# Patient Record
Sex: Female | Born: 1955 | Race: White | Hispanic: No | Marital: Married | Smoking: Never smoker
Health system: Southern US, Community
[De-identification: ages and names within clinical notes are randomized; demographics above are authoritative.]

## PROBLEM LIST (undated history)

## (undated) DIAGNOSIS — M1990 Unspecified osteoarthritis, unspecified site: Secondary | ICD-10-CM

## (undated) DIAGNOSIS — R053 Chronic cough: Secondary | ICD-10-CM

## (undated) DIAGNOSIS — J4540 Moderate persistent asthma, uncomplicated: Secondary | ICD-10-CM

## (undated) DIAGNOSIS — M810 Age-related osteoporosis without current pathological fracture: Secondary | ICD-10-CM

## (undated) DIAGNOSIS — R112 Nausea with vomiting, unspecified: Secondary | ICD-10-CM

## (undated) DIAGNOSIS — Z87442 Personal history of urinary calculi: Secondary | ICD-10-CM

## (undated) DIAGNOSIS — E041 Nontoxic single thyroid nodule: Secondary | ICD-10-CM

## (undated) DIAGNOSIS — R002 Palpitations: Secondary | ICD-10-CM

## (undated) DIAGNOSIS — Z8639 Personal history of other endocrine, nutritional and metabolic disease: Secondary | ICD-10-CM

## (undated) DIAGNOSIS — K219 Gastro-esophageal reflux disease without esophagitis: Secondary | ICD-10-CM

## (undated) DIAGNOSIS — Z8742 Personal history of other diseases of the female genital tract: Secondary | ICD-10-CM

## (undated) DIAGNOSIS — I10 Essential (primary) hypertension: Secondary | ICD-10-CM

## (undated) DIAGNOSIS — N200 Calculus of kidney: Secondary | ICD-10-CM

## (undated) DIAGNOSIS — E785 Hyperlipidemia, unspecified: Secondary | ICD-10-CM

## (undated) DIAGNOSIS — Z973 Presence of spectacles and contact lenses: Secondary | ICD-10-CM

## (undated) DIAGNOSIS — Z9889 Other specified postprocedural states: Secondary | ICD-10-CM

## (undated) DIAGNOSIS — R0982 Postnasal drip: Secondary | ICD-10-CM

## (undated) DIAGNOSIS — Z86010 Personal history of colonic polyps: Secondary | ICD-10-CM

## (undated) DIAGNOSIS — R05 Cough: Secondary | ICD-10-CM

## (undated) DIAGNOSIS — Z860101 Personal history of adenomatous and serrated colon polyps: Secondary | ICD-10-CM

## (undated) DIAGNOSIS — R058 Other specified cough: Secondary | ICD-10-CM

## (undated) DIAGNOSIS — E119 Type 2 diabetes mellitus without complications: Secondary | ICD-10-CM

## (undated) HISTORY — DX: Age-related osteoporosis without current pathological fracture: M81.0

## (undated) HISTORY — PX: URETERAL REIMPLANTION: SHX2611

## (undated) HISTORY — PX: TUBAL LIGATION: SHX77

## (undated) HISTORY — DX: Hyperlipidemia, unspecified: E78.5

## (undated) HISTORY — DX: Essential (primary) hypertension: I10

## (undated) HISTORY — PX: EXTRACORPOREAL SHOCK WAVE LITHOTRIPSY: SHX1557

---

## 1995-03-21 HISTORY — PX: TOTAL ABDOMINAL HYSTERECTOMY W/ BILATERAL SALPINGOOPHORECTOMY: SHX83

## 1998-05-22 ENCOUNTER — Ambulatory Visit (HOSPITAL_COMMUNITY): Admission: RE | Admit: 1998-05-22 | Discharge: 1998-05-22 | Payer: Self-pay

## 1998-05-22 ENCOUNTER — Encounter: Payer: Self-pay | Admitting: *Deleted

## 1998-06-16 ENCOUNTER — Ambulatory Visit (HOSPITAL_COMMUNITY): Admission: RE | Admit: 1998-06-16 | Discharge: 1998-06-16 | Payer: Self-pay | Admitting: Urology

## 1998-06-16 ENCOUNTER — Encounter: Payer: Self-pay | Admitting: Urology

## 1998-06-24 ENCOUNTER — Ambulatory Visit (HOSPITAL_COMMUNITY): Admission: RE | Admit: 1998-06-24 | Discharge: 1998-06-24 | Payer: Self-pay | Admitting: Urology

## 1998-06-24 ENCOUNTER — Encounter: Payer: Self-pay | Admitting: Urology

## 1998-06-26 ENCOUNTER — Encounter: Payer: Self-pay | Admitting: Emergency Medicine

## 1998-06-26 ENCOUNTER — Emergency Department (HOSPITAL_COMMUNITY): Admission: EM | Admit: 1998-06-26 | Discharge: 1998-06-26 | Payer: Self-pay | Admitting: Emergency Medicine

## 1998-08-11 ENCOUNTER — Inpatient Hospital Stay (HOSPITAL_COMMUNITY): Admission: RE | Admit: 1998-08-11 | Discharge: 1998-08-14 | Payer: Self-pay | Admitting: Urology

## 1999-11-10 ENCOUNTER — Other Ambulatory Visit: Admission: RE | Admit: 1999-11-10 | Discharge: 1999-11-10 | Payer: Self-pay | Admitting: *Deleted

## 1999-12-28 ENCOUNTER — Encounter: Payer: Self-pay | Admitting: *Deleted

## 1999-12-28 ENCOUNTER — Encounter: Admission: RE | Admit: 1999-12-28 | Discharge: 1999-12-28 | Payer: Self-pay | Admitting: *Deleted

## 2000-05-22 ENCOUNTER — Other Ambulatory Visit: Admission: RE | Admit: 2000-05-22 | Discharge: 2000-05-22 | Payer: Self-pay | Admitting: *Deleted

## 2001-01-31 ENCOUNTER — Encounter: Payer: Self-pay | Admitting: Urology

## 2001-01-31 ENCOUNTER — Ambulatory Visit (HOSPITAL_COMMUNITY): Admission: RE | Admit: 2001-01-31 | Discharge: 2001-01-31 | Payer: Self-pay | Admitting: Urology

## 2001-05-23 ENCOUNTER — Other Ambulatory Visit: Admission: RE | Admit: 2001-05-23 | Discharge: 2001-05-23 | Payer: Self-pay | Admitting: *Deleted

## 2002-05-26 ENCOUNTER — Other Ambulatory Visit: Admission: RE | Admit: 2002-05-26 | Discharge: 2002-05-26 | Payer: Self-pay | Admitting: *Deleted

## 2003-07-16 ENCOUNTER — Encounter: Admission: RE | Admit: 2003-07-16 | Discharge: 2003-07-16 | Payer: Self-pay | Admitting: Family Medicine

## 2003-08-18 ENCOUNTER — Other Ambulatory Visit: Admission: RE | Admit: 2003-08-18 | Discharge: 2003-08-18 | Payer: Self-pay | Admitting: Endocrinology

## 2004-07-27 ENCOUNTER — Encounter: Admission: RE | Admit: 2004-07-27 | Discharge: 2004-07-27 | Payer: Self-pay | Admitting: Obstetrics

## 2004-08-04 ENCOUNTER — Encounter: Admission: RE | Admit: 2004-08-04 | Discharge: 2004-08-04 | Payer: Self-pay | Admitting: Obstetrics

## 2005-08-09 ENCOUNTER — Encounter: Admission: RE | Admit: 2005-08-09 | Discharge: 2005-08-09 | Payer: Self-pay | Admitting: Endocrinology

## 2007-03-27 ENCOUNTER — Encounter: Admission: RE | Admit: 2007-03-27 | Discharge: 2007-03-27 | Payer: Self-pay | Admitting: Endocrinology

## 2010-03-02 ENCOUNTER — Encounter
Admission: RE | Admit: 2010-03-02 | Discharge: 2010-03-02 | Payer: Self-pay | Source: Home / Self Care | Attending: Obstetrics & Gynecology | Admitting: Obstetrics & Gynecology

## 2010-04-10 ENCOUNTER — Encounter: Payer: Self-pay | Admitting: Obstetrics

## 2011-04-10 ENCOUNTER — Other Ambulatory Visit: Payer: Self-pay | Admitting: Obstetrics & Gynecology

## 2011-04-10 DIAGNOSIS — Z1231 Encounter for screening mammogram for malignant neoplasm of breast: Secondary | ICD-10-CM

## 2011-04-19 ENCOUNTER — Ambulatory Visit
Admission: RE | Admit: 2011-04-19 | Discharge: 2011-04-19 | Disposition: A | Discharge status: Home / Self Care | Payer: 59 | Source: Ambulatory Visit | Attending: Obstetrics & Gynecology | Admitting: Obstetrics & Gynecology

## 2011-04-19 DIAGNOSIS — Z1231 Encounter for screening mammogram for malignant neoplasm of breast: Secondary | ICD-10-CM

## 2011-06-19 ENCOUNTER — Ambulatory Visit (INDEPENDENT_AMBULATORY_CARE_PROVIDER_SITE_OTHER): Payer: 59 | Admitting: Physician Assistant

## 2011-06-19 VITALS — BP 129/73 | HR 93 | Temp 98.6°F | Resp 16 | Ht 65.58 in | Wt 182.4 lb

## 2011-06-19 DIAGNOSIS — E119 Type 2 diabetes mellitus without complications: Secondary | ICD-10-CM | POA: Insufficient documentation

## 2011-06-19 DIAGNOSIS — R350 Frequency of micturition: Secondary | ICD-10-CM

## 2011-06-19 DIAGNOSIS — IMO0001 Reserved for inherently not codable concepts without codable children: Secondary | ICD-10-CM

## 2011-06-19 DIAGNOSIS — R300 Dysuria: Secondary | ICD-10-CM

## 2011-06-19 LAB — POCT URINALYSIS DIPSTICK
Bilirubin, UA: NEGATIVE
Blood, UA: NEGATIVE
Glucose, UA: NEGATIVE
Ketones, UA: NEGATIVE
Leukocytes, UA: NEGATIVE
Nitrite, UA: NEGATIVE
Protein, UA: NEGATIVE
Spec Grav, UA: 1.02
Urobilinogen, UA: 0.2
pH, UA: 7

## 2011-06-19 LAB — POCT UA - MICROSCOPIC ONLY
Casts, Ur, LPF, POC: NEGATIVE
Crystals, Ur, HPF, POC: NEGATIVE
Mucus, UA: NEGATIVE
RBC, urine, microscopic: NEGATIVE
Yeast, UA: NEGATIVE

## 2011-06-19 MED ORDER — CIPROFLOXACIN HCL 250 MG PO TABS: 250.0000 mg | ORAL_TABLET | Freq: Two times a day (BID) | ORAL | Status: DC

## 2011-06-19 NOTE — Progress Notes (Signed)
  Subjective:    Patient ID: Kimberly Duke, female    DOB: 1955/08/13, 56 y.o.   MRN: [id number]  HPI 5-7 day h/o urine having an odor and urine being more concentrated than normal.  No dysuria and no increase in frequency.  Patient states she usu has atypical symptoms when she has a bladder infection.  No abd pain, no vaginal discharge, no pelvic pain. FBG this am 209. Last A1C ~7 and was very recent. Endocrinologist-Balen  Review of Systems  All other systems reviewed and are negative.       Objective:   Physical Exam  Nursing note and vitals reviewed. Constitutional: She is oriented to person, place, and time. She appears well-developed and well-nourished.  HENT:  Head: Normocephalic and atraumatic.  Mouth/Throat: Oropharynx is clear and moist. No oropharyngeal exudate.  Cardiovascular: Normal rate, regular rhythm and normal heart sounds.  Exam reveals no gallop and no friction rub.   No murmur heard. Pulmonary/Chest: Effort normal and breath sounds normal.  Abdominal: Soft. Bowel sounds are normal. She exhibits no distension and no mass. There is no tenderness. There is no rebound and no guarding.       No CVA tenderness.  Neurological: She is alert and oriented to person, place, and time.  Skin: Skin is warm and dry.  Psychiatric: She has a normal mood and affect. Her behavior is normal.     Results for orders placed in visit on 06/19/11  POCT UA - MICROSCOPIC ONLY      Component Value Range   WBC, Ur, HPF, POC 0-3     RBC, urine, microscopic neg     Bacteria, U Microscopic 1+     Mucus, UA neg     Epithelial cells, urine per micros 0-5     Crystals, Ur, HPF, POC neg     Casts, Ur, LPF, POC neg     Yeast, UA neg    POCT URINALYSIS DIPSTICK      Component Value Range   Color, UA yellow     Clarity, UA clear     Glucose, UA neg     Bilirubin, UA neg     Ketones, UA neg     Spec Grav, UA 1.020     Blood, UA neg     pH, UA 7.0     Protein, UA neg     Urobilinogen, UA 0.2     Nitrite, UA neg     Leukocytes, UA Negative          Assessment & Plan:  Sending urine for culture and short course of antibiotics bc her cultures have frequently been positive.  Patient sees Dr. Horald Pollen again soon in regards to her diabetes. Increase water intake.

## 2011-06-23 LAB — URINE CULTURE: Colony Count: >=100000

## 2011-06-27 MED ORDER — CIPROFLOXACIN HCL 250 MG PO TABS: 250.0000 mg | ORAL_TABLET | Freq: Two times a day (BID) | ORAL | Status: AC

## 2011-06-27 NOTE — Progress Notes (Signed)
Addended by: Anders Simmonds on: 06/27/2011 02:53 PM   Modules accepted: Orders

## 2012-03-18 ENCOUNTER — Other Ambulatory Visit: Payer: Self-pay | Admitting: Obstetrics & Gynecology

## 2012-03-18 DIAGNOSIS — Z1231 Encounter for screening mammogram for malignant neoplasm of breast: Secondary | ICD-10-CM

## 2012-05-09 ENCOUNTER — Ambulatory Visit
Admission: RE | Admit: 2012-05-09 | Discharge: 2012-05-09 | Disposition: A | Discharge status: Home / Self Care | Payer: 59 | Source: Ambulatory Visit | Attending: Obstetrics & Gynecology | Admitting: Obstetrics & Gynecology

## 2012-05-09 DIAGNOSIS — Z1231 Encounter for screening mammogram for malignant neoplasm of breast: Secondary | ICD-10-CM

## 2012-09-30 ENCOUNTER — Ambulatory Visit (INDEPENDENT_AMBULATORY_CARE_PROVIDER_SITE_OTHER): Payer: 59 | Admitting: Family Medicine

## 2012-09-30 ENCOUNTER — Ambulatory Visit: Payer: 59

## 2012-09-30 VITALS — BP 140/80 | HR 75 | Temp 98.0°F | Resp 18 | Ht 65.5 in | Wt 184.8 lb

## 2012-09-30 DIAGNOSIS — Z0000 Encounter for general adult medical examination without abnormal findings: Secondary | ICD-10-CM

## 2012-09-30 DIAGNOSIS — N952 Postmenopausal atrophic vaginitis: Secondary | ICD-10-CM

## 2012-09-30 DIAGNOSIS — Z23 Encounter for immunization: Secondary | ICD-10-CM

## 2012-09-30 MED ORDER — ESTRADIOL 10 MCG VA TABS: ORAL_TABLET | VAGINAL | Status: DC

## 2012-09-30 NOTE — Progress Notes (Signed)
[facility] [address] [place] [address] [phone]  Date:  09/30/2012   Name:  Kimberly Duke   DOB:  07-17-1955   MRN:  [id number]  PCP:  No primary provider on file.    Chief Complaint: Annual Exam   History of Present Illness:  Kimberly Duke is a 57 y.o. very pleasant female patient who presents with the following:  Last seen here in April of 2013 for a UTI.   She is here for a CPE today.  She has seen an OBG in the past (Dr. Juliene Pina) and sees Dr. Talmage Nap for her IDDM and high cholesterol.  Dr. Talmage Nap recently checked a full battery of labs except for a CBC- she has a copy of these labs with her today.    She used premarin cream or a short period in the past- just used it a couple of times.  However, she now notes that her vaginal area has become thin and will sometimes bleed after she has sex for the last year or so.  She would like to try a topical estrogen therapy again.    She is s/p total hysterectomy about 10 years ago.  Took oral HRT until she turned 50, then stopped.   No history of abnormal pap.  Hysterectomy performed due to a "benign tumor ?fibroid.  She had several miscarriages, and also has one daughter who is 27 years old.    She has also noted a cough for many years- this is chronic.  She does not smoke, but she was exposed to second hand smoke as a child.  She would like to have a CXR.    Her last mammogram was earlier this year- normal.  She has not yet had a colonoscopy.   Patient Active Problem List   Diagnosis Date Noted  . Diabetes mellitus 06/19/2011    Past Medical History  Diagnosis Date  . Anemia   . Diabetes mellitus without complication   . Hyperlipidemia     Past Surgical History  Procedure Laterality Date  . Abdominal hysterectomy    . Tubal ligation      History  Substance Use Topics  . Smoking status: Never Smoker   . Smokeless tobacco: Not on file  . Alcohol Use: Not on file    Family History  Problem  Relation Age of Onset  . Diabetes Mother   . Hyperlipidemia Mother   . COPD Father   . Diabetes Sister   . Cancer Paternal Grandmother   . Cancer Paternal Grandfather     Allergies  Allergen Reactions  . Amoxicillin Hives  . Darvon Nausea And Vomiting    Medication list has been reviewed and updated.  Current Outpatient Prescriptions on File Prior to Visit  Medication Sig Dispense Refill  . glimepiride (AMARYL) 4 MG tablet Take 4 mg by mouth 2 (two) times daily.      Marland Kitchen loratadine (CLARITIN) 10 MG tablet Take 10 mg by mouth daily.      . metFORMIN (GLUCOPHAGE) 500 MG tablet Take 500 mg by mouth 2 (two) times daily with a meal.      . Multiple Vitamins-Minerals (CENTRUM ULTRA WOMENS PO) Take 1 tablet by mouth daily.      Marland Kitchen gemfibrozil (LOPID) 600 MG tablet Take 600 mg by mouth 2 (two) times daily before a meal.       No current facility-administered medications on file prior to visit.    Review of  Systems:  As per HPI- otherwise negative.   Physical Examination: Filed Vitals:   09/30/12 1132  BP: 140/80  Pulse: 75  Temp: 98 F (36.7 C)  Resp: 18   Filed Vitals:   09/30/12 1132  Height: 5' 5.5" (1.664 m)  Weight: 184 lb 12.8 oz (83.825 kg)   Body mass index is 30.27 kg/(m^2). Ideal Body Weight: Weight in (lb) to have BMI = 25: 152.2  GEN: WDWN, NAD, Non-toxic, A & O x 3, overweight, looks well HEENT: Atraumatic, Normocephalic. Neck supple. No masses, No LAD.  Bilateral TM wnl, oropharynx normal.  PEERL,EOMI.   Ears and Nose: No external deformity. CV: RRR, No M/G/R. No JVD. No thrill. No extra heart sounds. PULM: CTA B, no wheezes, crackles, rhonchi. No retractions. No resp. distress. No accessory muscle use. ABD: S, NT, ND, +BS. No rebound. No HSM. EXTR: No c/c/e NEURO Normal gait.  PSYCH: Normally interactive. Conversant. Not depressed or anxious appearing.  Calm demeanor.  Breast: normal, no masses, dimpling or discharge.  GU: s/p total hysterectomy.   Vaginal mucosa is slightly irritated and thin/ dry.    [facility] reading (PRIMARY) by  Dr. Patsy Lager. CXR: negative   CHEST - 2 VIEW  Comparison: None.  Findings: Lung volumes are within normal limits. Cardiac size and mediastinal contours are within normal limits. Visualized tracheal air column is within normal limits. No pneumothorax, pulmonary edema, pleural effusion or confluent pulmonary opacity. Ventricular size and configuration are within normal limits.  IMPRESSION: No acute cardiopulmonary abnormality.  Clinically significant discrepancy from primary report, if provided: None  Assessment and Plan: Physical exam - Plan: DG Chest 2 View, Tdap vaccine greater than or equal to 7yo IM  Vaginal atrophy - Plan: Estradiol 10 MCG TABS vaginal tablet  CPE- CXR done per her report.  It is reassuring.  Suggested that she try a steroid nasal spray for possible allergies, or a medication for stomach acid for possible GERD- either of these could be causing her cough. However, at this time she is not bothered much by the cough and does not want to take any further meds  Continue to follow- up DM and CHL with Dr. Talmage Nap.  Offered to draw a CBC today; she prefers to have Dr. Talmage Nap include this in her next labs as she regularly has blood drawn at her office  Pt to schedule colonoscopy Tdap updated  Vaginal atrophy: discussed increased risk of stroke, heart attack, other adverse effects with HRT.  However, risk is thought to be lower with topical therapy, as absorption is lower and systemic estrogen levels have been shown to remain nearly at baseline (although not yet tested in a large study).  Suggested that we try vagifem as it is very low dose and has less systemic absorption.  She is amenable to this idea.  Discussed possible interaction between lofibra and vagifem with PharD- risk is low due to low systemic absorption of estrogen.  She will try the vagifem twice a week and let me know if not  helpful.   Signed Abbe Amsterdam, MD

## 2012-09-30 NOTE — Patient Instructions (Addendum)
Try the vagifem tablet twice a week for your vaginal symptoms.  Let me know if this does not resolve your discomfort and bleeding   It is time to schedule a colonoscopy.  Please contact the GI doctor of your choice [facility], [place] or [facility]) and ask to schedule a screening colonoscopy  At your next visit please ask Dr. Talmage Nap to order a CBC for you- if any abnormality she can certainly send the results to me.

## 2013-04-03 ENCOUNTER — Institutional Professional Consult (permissible substitution): Payer: 59 | Admitting: Internal Medicine

## 2013-04-08 ENCOUNTER — Telehealth: Payer: Self-pay

## 2013-04-08 NOTE — Telephone Encounter (Signed)
PATIENT IS REQUESTING A COPY OF HER CHEST XRAY THAT SHE HAD DONE HERE LAST YEAR TO BE PICKED UP TOMORROW IF POSSIBLE  [phone] WHEN READY

## 2013-04-09 ENCOUNTER — Encounter: Payer: Self-pay | Admitting: Internal Medicine

## 2013-04-09 NOTE — Telephone Encounter (Signed)
Xray copied and put in the p/u folder for pt. LM to advise pt.

## 2013-04-10 ENCOUNTER — Ambulatory Visit (INDEPENDENT_AMBULATORY_CARE_PROVIDER_SITE_OTHER): Payer: 59 | Admitting: Internal Medicine

## 2013-04-10 ENCOUNTER — Encounter (INDEPENDENT_AMBULATORY_CARE_PROVIDER_SITE_OTHER): Payer: Self-pay

## 2013-04-10 ENCOUNTER — Encounter: Payer: Self-pay | Admitting: Internal Medicine

## 2013-04-10 VITALS — BP 140/64 | HR 86 | Temp 98.1°F | Ht 66.5 in | Wt 183.2 lb

## 2013-04-10 DIAGNOSIS — R05 Cough: Secondary | ICD-10-CM | POA: Insufficient documentation

## 2013-04-10 DIAGNOSIS — R059 Cough, unspecified: Secondary | ICD-10-CM

## 2013-04-10 DIAGNOSIS — R058 Other specified cough: Secondary | ICD-10-CM | POA: Insufficient documentation

## 2013-04-10 MED ORDER — PANTOPRAZOLE SODIUM 40 MG PO TBEC
40.0000 mg | DELAYED_RELEASE_TABLET | Freq: Every day | ORAL | Status: DC
Start: 2013-04-10 — End: 2013-04-10

## 2013-04-10 MED ORDER — FAMOTIDINE 20 MG PO TABS: ORAL_TABLET | ORAL | Status: DC

## 2013-04-10 MED ORDER — CETIRIZINE HCL 10 MG PO TABS: 10.0000 mg | ORAL_TABLET | Freq: Every day | ORAL | Status: DC

## 2013-04-10 MED ORDER — PANTOPRAZOLE SODIUM 40 MG PO TBEC: 40.0000 mg | DELAYED_RELEASE_TABLET | Freq: Every day | ORAL | Status: DC

## 2013-04-10 NOTE — Progress Notes (Signed)
   Subjective:    Patient ID: Kimberly Duke, female    DOB: 09/23/1955   MRN: [id number]  HPI  [age] yowf never smoker with daily  cough since 1979 gradual onset w/in a few months  p her last child was born    referred 04/10/2013 by Dr Talmage NapBalan to pulmonary clinic.  04/10/2013 1st [facility] office visit/ Kimberly Duke cc cough x 30 y min productive day >> night notes 20 min after stirring assoc with mild rhinitis better transiently clariton never tried inhalers, worse with voice use and fumes/cleaning agents.  No obvious other patterns in day to day or daytime variabilty or assoc chronic cough or cp or chest tightness, subjective wheeze overt sinus or hb symptoms. No unusual exp hx or h/o childhood pna/ asthma or knowledge of premature birth.  Sleeping ok without nocturnal  or early am exacerbation  of respiratory  c/o's or need for noct saba. Also denies any obvious fluctuation of symptoms with weather or environmental changes or other aggravating or alleviating factors except as outlined above   Current Medications, Allergies, Complete Past Medical History, Past Surgical History, Family History, and Social History were reviewed in [facility] electronic medical record.          Review of Systems  Constitutional: Negative for fever, chills and unexpected weight change.  HENT: Negative for congestion, dental problem, ear pain, nosebleeds, postnasal drip, rhinorrhea, sinus pressure, sneezing, sore throat, trouble swallowing and voice change.   Eyes: Negative for visual disturbance.  Respiratory: Positive for cough. Negative for choking and shortness of breath.   Cardiovascular: Negative for chest pain and leg swelling.  Gastrointestinal: Negative for vomiting, abdominal pain and diarrhea.  Genitourinary: Negative for difficulty urinating.  Musculoskeletal: Negative for arthralgias.  Skin: Negative for rash.  Neurological: Negative for tremors, syncope and headaches.  Hematological: Does not  bruise/bleed easily.       Objective:   Physical Exam  Wt Readings from Last 3 Encounters:  04/10/13 183 lb 3.2 oz (83.099 kg)  09/30/12 184 lb 12.8 oz (83.825 kg)  06/19/11 182 lb 6.4 oz (82.736 kg)      HEENT: nl dentition, turbinates, and orophanx. Nl external ear canals without cough reflex   NECK :  without JVD/Nodes/TM/ nl carotid upstrokes bilaterally   LUNGS: no acc muscle use, clear to A and P bilaterally without cough on insp or exp maneuvers   CV:  RRR  no s3 or murmur or increase in P2, no edema   ABD:  soft and nontender with nl excursion in the supine position. No bruits or organomegaly, bowel sounds nl  MS:  warm without deformities, calf tenderness, cyanosis or clubbing  SKIN: warm and dry without lesions    NEURO:  alert, approp, no deficits    cxr 09/30/12   No acute cardiopulmonary abnormality     Assessment & Plan:

## 2013-04-10 NOTE — Patient Instructions (Addendum)
Zyrtec 10 mg at bedtime  Pantoprazole (protonix) 40 mg   Take 30-60 min before first meal of the day and Pepcid 20 mg one bedtime until return to office - this is the best way to tell whether stomach acid is contributing to your problem.    GERD (REFLUX)  is an extremely common cause of respiratory symptoms, many times with no significant heartburn at all.    It can be treated with medication, but also with lifestyle changes including avoidance of late meals, excessive alcohol, smoking cessation, and avoid fatty foods, chocolate, peppermint, colas, red wine, and acidic juices such as orange juice.  NO MINT OR MENTHOL PRODUCTS SO NO COUGH DROPS  USE SUGARLESS CANDY INSTEAD (jolley ranchers or Stover's)  NO OIL BASED VITAMINS - use powdered substitutes.   Please schedule a follow up office visit in 4 weeks, sooner if needed

## 2013-04-11 NOTE — Assessment & Plan Note (Signed)
The most common causes of chronic cough in immunocompetent adults include the following: upper airway cough syndrome (UACS), previously referred to as postnasal drip syndrome (PNDS), which is caused by variety of rhinosinus conditions; (2) asthma; (3) GERD; (4) chronic bronchitis from cigarette smoking or other inhaled environmental irritants; (5) nonasthmatic eosinophilic bronchitis; and (6) bronchiectasis.   These conditions, singly or in combination, have accounted for up to 94% of the causes of chronic cough in prospective studies.   Other conditions have constituted no >6% of the causes in prospective studies These have included bronchogenic carcinoma, chronic interstitial pneumonia, sarcoidosis, left ventricular failure, ACEI-induced cough, and aspiration from a condition associated with pharyngeal dysfunction.    Chronic cough is often simultaneously caused by more than one condition. A single cause has been found from 38 to 82% of the time, multiple causes from 18 to 62%. Multiply caused cough has been the result of three diseases up to 42% of the time.   Given the aggravation with voice use and fumes day >> night this is almost certainly  Classic Upper airway cough syndrome, so named because it's frequently impossible to sort out how much is  CR/sinusitis with freq throat clearing (which can be related to primary GERD)   vs  causing  secondary (" extra esophageal")  GERD from wide swings in gastric pressure that occur with throat clearing, often  promoting self use of mint and menthol lozenges that reduce the lower esophageal sphincter tone and exacerbate the problem further in a cyclical fashion.   These are the same pts (now being labeled as having "irritable larynx syndrome" by some cough centers) who not infrequently have a history of having failed to tolerate ace inhibitors,  dry powder inhalers or biphosphonates or report having atypical reflux symptoms that don't respond to standard doses  of PPI , and are easily confused as having aecopd or asthma flares by even experienced allergists/ pulmonologists.  Will see if she tolerated hs zyrtec (to see if it helps as she reports some response to antihistamines in the past and to see if she tolerates it and if so consider even 1st gen H1 as per guidelines) and max rx for gerd with diet/ ppi/ h2 hs   Discussed with pt The standardized cough guidelines published in Chest by [employer] in 2006 are still the best available and consist of a multiple step process (up to 12!) , not a single office visit,  and are intended  to address this problem logically,  with an alogrithm dependent on response to empiric treatment at  each progressive step  to determine a specific diagnosis with  minimal addtional testing needed. Therefore if adherence is an issue or can't be accurately verified,  it's very unlikely the standard evaluation and treatment will be successful here.    Furthermore, response to therapy (other than acute cough suppression, which should only be used short term with avoidance of narcotic containing cough syrups if possible), can be a gradual process for which the patient may perceive immediate benefit.  Unlike going to an eye doctor where the best perscription is almost always the first one and is immediately effective, this is almost never the case in the management of chronic cough syndromes. Therefore the patient needs to commit up front to consistently adhere to recommendations  for up to 6 weeks of therapy directed at the likely underlying problem(s) before the response can be reasonably evaluated.

## 2013-05-09 ENCOUNTER — Encounter: Payer: Self-pay | Admitting: *Deleted

## 2013-05-09 ENCOUNTER — Encounter: Payer: Self-pay | Admitting: Internal Medicine

## 2013-05-09 ENCOUNTER — Ambulatory Visit (INDEPENDENT_AMBULATORY_CARE_PROVIDER_SITE_OTHER): Payer: 59 | Admitting: Internal Medicine

## 2013-05-09 VITALS — BP 114/66 | HR 90 | Temp 97.8°F | Ht 66.0 in | Wt 184.4 lb

## 2013-05-09 DIAGNOSIS — R05 Cough: Secondary | ICD-10-CM

## 2013-05-09 DIAGNOSIS — R058 Other specified cough: Secondary | ICD-10-CM

## 2013-05-09 DIAGNOSIS — R059 Cough, unspecified: Secondary | ICD-10-CM

## 2013-05-09 MED ORDER — CETIRIZINE HCL 10 MG PO TABS: 10.0000 mg | ORAL_TABLET | Freq: Every day | ORAL | Status: DC

## 2013-05-09 MED ORDER — FAMOTIDINE 20 MG PO TABS
ORAL_TABLET | ORAL | Status: DC
Start: 2013-05-09 — End: 2013-11-13

## 2013-05-09 MED ORDER — PANTOPRAZOLE SODIUM 40 MG PO TBEC: 40.0000 mg | DELAYED_RELEASE_TABLET | Freq: Every day | ORAL | Status: DC

## 2013-05-09 NOTE — Progress Notes (Signed)
Subjective:    Patient ID: Kimberly Duke, female    DOB: Apr 22, 1955   MRN: [id number]   Brief patient profile:  34 yowf never smoker with daily  cough since 1979 gradual onset w/in a few months  p her last child was born  [birth year] referred 04/10/2013 by Dr Talmage Nap to pulmonary clinic for chronic cough.    History of Present Illness  04/10/2013 1st [facility] office visit/ Kimberly Duke cc cough x 30 y min productive day >> night notes 20 min after stirring assoc with mild rhinitis better transiently clariton never tried inhalers, worse with voice use and fumes/cleaning agents. rec Zyrtec 10 mg at bedtime Pantoprazole (protonix) 40 mg   Take 30-60 min before first meal of the day and Pepcid 20 mg one bedtime until return to office - this is the best way to tell whether stomach acid is contributing to your problem.   GERD diet   05/09/2013 f/u ov/Kimberly Duke re: cough x 30 y worse with voice use/ zyrtec at bedtime  Chief Complaint  Patient presents with  . Follow-up    Pt c/o occ nonprod cough, mostly in A.M.  Pt states it has improved since lv.       No obvious other patterns in day to day or daytime variabilty or assoc sob or cp or chest tightness, subjective wheeze overt sinus or hb symptoms. No unusual exp hx or h/o childhood pna/ asthma or knowledge of premature birth.  Sleeping ok without nocturnal  or early am exacerbation  of respiratory  c/o's or need for noct saba. Also denies any obvious fluctuation of symptoms with weather or environmental changes or other aggravating or alleviating factors except as outlined above   Current Medications, Allergies, Complete Past Medical History, Past Surgical History, Family History, and Social History were reviewed in [employer] [facility] record.  ROS  The following are not active complaints unless bolded sore throat, dysphagia, dental problems, itching, sneezing,  nasal congestion or excess/ purulent secretions, ear ache,   fever, chills,  sweats, unintended wt loss, pleuritic or exertional cp, hemoptysis,  orthopnea pnd or leg swelling, presyncope, palpitations, heartburn, abdominal pain, anorexia, nausea, vomiting, diarrhea  or change in bowel or urinary habits, change in stools or urine, dysuria,hematuria,  rash, arthralgias, visual complaints, headache, numbness weakness or ataxia or problems with walking or coordination,  change in mood/affect or memory.                       Objective:   Physical Exam   05/09/2013       184  Wt Readings from Last 3 Encounters:  04/10/13 183 lb 3.2 oz (83.099 kg)  09/30/12 184 lb 12.8 oz (83.825 kg)  06/19/11 182 lb 6.4 oz (82.736 kg)      HEENT: nl dentition, turbinates, and orophanx. Nl external ear canals without cough reflex   NECK :  without JVD/Nodes/TM/ nl carotid upstrokes bilaterally   LUNGS: no acc muscle use, clear to A and P bilaterally without cough on insp or exp maneuvers   CV:  RRR  no s3 or murmur or increase in P2, no edema   ABD:  soft and nontender with nl excursion in the supine position. No bruits or organomegaly, bowel sounds nl  MS:  warm without deformities, calf tenderness, cyanosis or clubbing  SKIN: warm and dry without lesions    NEURO:  alert, approp, no deficits    cxr 09/30/12   No  acute cardiopulmonary abnormality     Assessment & Plan:

## 2013-05-09 NOTE — Patient Instructions (Addendum)
Continue Pantoprazole (protonix) 40 mg   Take 30-60 min before first meal of the day and Pepcid 20 mg one bedtime until return to office - this is the best way to tell whether stomach acid is contributing to your problem.    Try taking the zyrtec 10 mg in am if doesn't make you too sleepy, otherwise just continue to take in pm

## 2013-05-10 NOTE — Assessment & Plan Note (Signed)
Improved and likely due to  Classic Upper airway cough syndrome, so named because it's frequently impossible to sort out how much is  CR/sinusitis with freq throat clearing (which can be related to primary GERD)   vs  causing  secondary (" extra esophageal")  GERD from wide swings in gastric pressure that occur with throat clearing, often  promoting self use of mint and menthol lozenges that reduce the lower esophageal sphincter tone and exacerbate the problem further in a cyclical fashion.   These are the same pts (now being labeled as having "irritable larynx syndrome" by some cough centers) who not infrequently have a history of having failed to tolerate ace inhibitors,  dry powder inhalers or biphosphonates or report having atypical reflux symptoms that don't respond to standard doses of PPI , and are easily confused as having aecopd or asthma flares by even experienced allergists/ pulmonologists.   For now just rec she try changing the zyrtec to am if can tolerate the drowsiness she may note by taking it then and continue max rx directed at gerd/ other option is to add neurontin trial.

## 2013-08-14 ENCOUNTER — Encounter (INDEPENDENT_AMBULATORY_CARE_PROVIDER_SITE_OTHER): Payer: Self-pay

## 2013-08-14 ENCOUNTER — Ambulatory Visit (INDEPENDENT_AMBULATORY_CARE_PROVIDER_SITE_OTHER): Payer: 59 | Admitting: Internal Medicine

## 2013-08-14 ENCOUNTER — Encounter: Payer: Self-pay | Admitting: Internal Medicine

## 2013-08-14 ENCOUNTER — Other Ambulatory Visit (INDEPENDENT_AMBULATORY_CARE_PROVIDER_SITE_OTHER): Payer: 59

## 2013-08-14 VITALS — BP 140/96 | HR 89 | Temp 98.7°F | Ht 66.5 in | Wt 187.0 lb

## 2013-08-14 DIAGNOSIS — R059 Cough, unspecified: Secondary | ICD-10-CM

## 2013-08-14 DIAGNOSIS — R058 Other specified cough: Secondary | ICD-10-CM

## 2013-08-14 DIAGNOSIS — R05 Cough: Secondary | ICD-10-CM

## 2013-08-14 LAB — CBC WITH DIFFERENTIAL/PLATELET
Basophils Absolute: 0 10*3/uL (ref 0.0–0.1)
Basophils Relative: 0.8 % (ref 0.0–3.0)
Eosinophils Absolute: 0.3 10*3/uL (ref 0.0–0.7)
Eosinophils Relative: 4.8 % (ref 0.0–5.0)
HCT: 36.2 % (ref 36.0–46.0)
Hemoglobin: 12 g/dL (ref 12.0–15.0)
Lymphocytes Relative: 32.8 % (ref 12.0–46.0)
Lymphs Abs: 1.9 10*3/uL (ref 0.7–4.0)
MCHC: 33.2 g/dL (ref 30.0–36.0)
MCV: 85.6 fl (ref 78.0–100.0)
Monocytes Absolute: 0.4 10*3/uL (ref 0.1–1.0)
Monocytes Relative: 7.4 % (ref 3.0–12.0)
Neutro Abs: 3.1 10*3/uL (ref 1.4–7.7)
Neutrophils Relative %: 54.2 % (ref 43.0–77.0)
Platelets: 213 10*3/uL (ref 150.0–400.0)
RBC: 4.23 Mil/uL (ref 3.87–5.11)
RDW: 12.6 % (ref 11.5–15.5)
WBC: 5.8 10*3/uL (ref 4.0–10.5)

## 2013-08-14 NOTE — Progress Notes (Signed)
Subjective:    Patient ID: Kimberly Duke, female    DOB: [birth year]   MRN: [id number]   Brief patient profile:  49  yowf never smoker with daily  cough since 1979 gradual onset w/in a few months  p her last child was born  [birth year] referred 04/10/2013 by Dr Talmage Nap to pulmonary clinic for chronic cough.    History of Present Illness  04/10/2013 1st [facility] office visit/ Sunny Gains cc cough x 30 y min productive day >> night notes 20 min after stirring assoc with mild rhinitis better transiently clariton never tried inhalers, worse with voice use and fumes/cleaning agents. rec Zyrtec 10 mg at bedtime Pantoprazole (protonix) 40 mg   Take 30-60 min before first meal of the day and Pepcid 20 mg one bedtime until return to office - this is the best way to tell whether stomach acid is contributing to your problem.   GERD diet   05/09/2013 f/u ov/Laakea Pereira re: cough x 30 y worse with voice use/ zyrtec at bedtime  Chief Complaint  Patient presents with  . Follow-up    Pt c/o occ nonprod cough, mostly in A.M.  Pt states it has improved since lv.    rec Continue Pantoprazole (protonix) 40 mg   Take 30-60 min before first meal of the day and Pepcid 20 mg one bedtime until return to office - this is the best way to tell whether stomach acid is contributing to your problem.   Try taking the zyrtec 10 mg in am if doesn't make you too sleepy, otherwise just continue to take in pm   08/14/2013 f/u ov/Mitsuo Budnick re:  Cough x 30 y on zyrtec 10 mg in am Chief Complaint  Patient presents with  . Cough    Cough is improved. Feels that her allergies are causing some mucus production in her throat at this time.  not taking ppi consistently nor pepcid and since decreased it attributes the worse cough to her allergies but note the cough has not historically correlated with seasonal changes.  Worse with perfume exp  Cough remains dry with sense of pnds but no excess mucus even in am and problem is day >>> night   No  obvious other patterns in day to day or daytime variabilty or assoc sob or cp or chest tightness, subjective wheeze overt sinus or hb symptoms. No unusual exp hx or h/o childhood pna/ asthma or knowledge of premature birth.  Sleeping ok without nocturnal  or early am exacerbation  of respiratory  c/o's or need for noct saba. Also denies any obvious fluctuation of symptoms with weather or environmental changes or other aggravating or alleviating factors except as outlined above   Current Medications, Allergies, Complete Past Medical History, Past Surgical History, Family History, and Social History were reviewed in [employer] record.  ROS  The following are not active complaints unless bolded sore throat, dysphagia, dental problems, itching, sneezing,  nasal congestion or excess/ purulent secretions, ear ache,   fever, chills, sweats, unintended wt loss, pleuritic or exertional cp, hemoptysis,  orthopnea pnd or leg swelling, presyncope, palpitations, heartburn, abdominal pain, anorexia, nausea, vomiting, diarrhea  or change in bowel or urinary habits, change in stools or urine, dysuria,hematuria,  rash, arthralgias, visual complaints, headache, numbness weakness or ataxia or problems with walking or coordination,  change in mood/affect or memory.           Objective:   Physical Exam   05/09/2013  184 > 08/14/2013  187  Wt Readings from Last 3 Encounters:  04/10/13 183 lb 3.2 oz (83.099 kg)  09/30/12 184 lb 12.8 oz (83.825 kg)  06/19/11 182 lb 6.4 oz (82.736 kg)      Classic voice fatigue amb wf   HEENT: nl dentition, turbinates, and orophanx. Nl external ear canals without cough reflex   NECK :  without JVD/Nodes/TM/ nl carotid upstrokes bilaterally   LUNGS: no acc muscle use, clear to A and P bilaterally without cough on insp or exp maneuvers   CV:  RRR  no s3 or murmur or increase in P2, no edema   ABD:  soft and nontender with nl excursion in the  supine position. No bruits or organomegaly, bowel sounds nl  MS:  warm without deformities, calf tenderness, cyanosis or clubbing       cxr 09/30/12   No acute cardiopulmonary abnormality     Assessment & Plan:

## 2013-08-14 NOTE — Patient Instructions (Addendum)
Please remember to go to the lab  department downstairs for your tests - we will call you with the results when they are available.  I strongly recommend you maintain Pantoprazole (protonix) 40 mg   Take 30-60 min before first meal of the day and Pepcid 20 mg one bedtime   GERD (REFLUX)  is an extremely common cause of respiratory symptoms, many times with no significant heartburn at all.    It can be treated with medication, but also with lifestyle changes including avoidance of late meals, excessive alcohol, smoking cessation, and avoid fatty foods, chocolate, peppermint, colas, red wine, and acidic juices such as orange juice.  NO MINT OR MENTHOL PRODUCTS SO NO COUGH DROPS  USE SUGARLESS CANDY INSTEAD (jolley ranchers or Stover's)  NO OIL BASED VITAMINS - use powdered substitutes.  It's ok to stop the zyrtec after allergy season and just take it as needed.  Follow up here will be as needed - call if you would like referral to the voice center at [facility]

## 2013-08-15 ENCOUNTER — Encounter: Payer: Self-pay | Admitting: Internal Medicine

## 2013-08-15 LAB — ALLERGY PROFILE REGION II-DC, DE, MD, ~~LOC~~, VA
Allergen, D pternoyssinus,d7: <0.1 kU/L
Alternaria Alternata: <0.1 kU/L
Aspergillus fumigatus, m3: <0.1 kU/L
Bermuda Grass: <0.1 kU/L
Box Elder IgE: <0.1 kU/L
Cat Dander: <0.1 kU/L
Cladosporium Herbarum: <0.1 kU/L
Cockroach: <0.1 kU/L
Common Ragweed: <0.1 kU/L
D. farinae: <0.1 kU/L
Dog Dander: <0.1 kU/L
Elm IgE: <0.1 kU/L
IgE (Immunoglobulin E), Serum: 27.9 IU/mL (ref 0.0–180.0)
Johnson Grass: <0.1 kU/L
Lamb's Quarters: <0.1 kU/L
Meadow Grass: <0.1 kU/L
Oak: <0.1 kU/L
Pecan/Hickory Tree IgE: <0.1 kU/L

## 2013-08-15 NOTE — Assessment & Plan Note (Signed)
-   allergy profile 08/14/2013 >  Eos neg, IgE 27.9   Still strongly favor here dx of  Classic Upper airway cough syndrome, so named because it's frequently impossible to sort out how much is  CR/sinusitis with freq throat clearing (which can be related to primary GERD)   vs  causing  secondary (" extra esophageal")  GERD from wide swings in gastric pressure that occur with throat clearing, often  promoting self use of mint and menthol lozenges that reduce the lower esophageal sphincter tone and exacerbate the problem further in a cyclical fashion.   These are the same pts (now being labeled as having "irritable larynx syndrome" by some cough centers) who not infrequently have a history of having failed to tolerate ace inhibitors,  dry powder inhalers or biphosphonates or report having atypical reflux symptoms that don't respond to standard doses of PPI , and are easily confused as having aecopd or asthma flares by even experienced allergists/ pulmonologists.   rec max gerd rx x 6 weeks minimum and then try off zyrtec, if not happy with results next step is voice center at [facility]

## 2013-08-15 NOTE — Progress Notes (Signed)
Quick Note:  LMTCB on home and mobile ______

## 2013-08-19 ENCOUNTER — Telehealth: Payer: Self-pay | Admitting: Internal Medicine

## 2013-08-19 NOTE — Telephone Encounter (Signed)
Notes Recorded by Nyoka Cowden, MD on 08/15/2013 at 11:48 AM Call patient : Studies are unremarkable, no change in recs- no resp allergens identified ---  lmomtcb x1 for pt. Is it okay to leave detailed message on VM?

## 2013-08-19 NOTE — Telephone Encounter (Signed)
Pt called again requesting her lab results be released to her mychart Informed pt of her lab results and that her results will also be released to mychart Nothing further needed; will sign off.

## 2013-08-19 NOTE — Telephone Encounter (Signed)
Ok to leave detailed msg on her phone

## 2013-10-28 ENCOUNTER — Other Ambulatory Visit: Payer: Self-pay | Admitting: Urology

## 2013-11-12 ENCOUNTER — Encounter (HOSPITAL_BASED_OUTPATIENT_CLINIC_OR_DEPARTMENT_OTHER): Payer: Self-pay | Admitting: *Deleted

## 2013-11-13 ENCOUNTER — Encounter (HOSPITAL_BASED_OUTPATIENT_CLINIC_OR_DEPARTMENT_OTHER): Payer: Self-pay | Admitting: *Deleted

## 2013-11-13 NOTE — Progress Notes (Signed)
NPO AFTER MN. ARRIVE AT 1610. NEEDS ISTAT 8 AND EKG. WILL TAKE PROTONIX AM DOS W/ SIPS OF WATER.

## 2013-11-19 ENCOUNTER — Ambulatory Visit (HOSPITAL_BASED_OUTPATIENT_CLINIC_OR_DEPARTMENT_OTHER): Payer: 59 | Admitting: Certified Registered"

## 2013-11-19 ENCOUNTER — Ambulatory Visit (HOSPITAL_BASED_OUTPATIENT_CLINIC_OR_DEPARTMENT_OTHER)
Admission: RE | Admit: 2013-11-19 | Discharge: 2013-11-19 | Disposition: A | Discharge status: Home / Self Care | Payer: 59 | Source: Ambulatory Visit | Attending: Urology | Admitting: Urology

## 2013-11-19 ENCOUNTER — Encounter (HOSPITAL_BASED_OUTPATIENT_CLINIC_OR_DEPARTMENT_OTHER): Payer: 59 | Admitting: Certified Registered"

## 2013-11-19 ENCOUNTER — Encounter (HOSPITAL_BASED_OUTPATIENT_CLINIC_OR_DEPARTMENT_OTHER): Payer: Self-pay | Admitting: Certified Registered"

## 2013-11-19 ENCOUNTER — Encounter (HOSPITAL_BASED_OUTPATIENT_CLINIC_OR_DEPARTMENT_OTHER)
Admission: RE | Disposition: A | Discharge status: Home / Self Care | Payer: Self-pay | Source: Ambulatory Visit | Attending: Urology

## 2013-11-19 DIAGNOSIS — Z90710 Acquired absence of both cervix and uterus: Secondary | ICD-10-CM | POA: Diagnosis not present

## 2013-11-19 DIAGNOSIS — N200 Calculus of kidney: Secondary | ICD-10-CM | POA: Insufficient documentation

## 2013-11-19 DIAGNOSIS — M129 Arthropathy, unspecified: Secondary | ICD-10-CM | POA: Diagnosis not present

## 2013-11-19 DIAGNOSIS — E119 Type 2 diabetes mellitus without complications: Secondary | ICD-10-CM | POA: Diagnosis not present

## 2013-11-19 HISTORY — DX: Cough: R05

## 2013-11-19 HISTORY — DX: Presence of spectacles and contact lenses: Z97.3

## 2013-11-19 HISTORY — DX: Personal history of other endocrine, nutritional and metabolic disease: Z86.39

## 2013-11-19 HISTORY — PX: HOLMIUM LASER APPLICATION: SHX5852

## 2013-11-19 HISTORY — DX: Type 2 diabetes mellitus without complications: E11.9

## 2013-11-19 HISTORY — DX: Gastro-esophageal reflux disease without esophagitis: K21.9

## 2013-11-19 HISTORY — PX: CYSTOSCOPY WITH URETEROSCOPY AND STENT PLACEMENT: SHX6377

## 2013-11-19 HISTORY — DX: Nontoxic single thyroid nodule: E04.1

## 2013-11-19 HISTORY — DX: Personal history of other diseases of the female genital tract: Z87.42

## 2013-11-19 HISTORY — DX: Unspecified osteoarthritis, unspecified site: M19.90

## 2013-11-19 HISTORY — DX: Personal history of urinary calculi: Z87.442

## 2013-11-19 HISTORY — DX: Other specified cough: R05.8

## 2013-11-19 HISTORY — DX: Calculus of kidney: N20.0

## 2013-11-19 HISTORY — DX: Other specified postprocedural states: Z98.890

## 2013-11-19 HISTORY — DX: Other specified postprocedural states: R11.2

## 2013-11-19 HISTORY — DX: Nausea with vomiting, unspecified: R11.2

## 2013-11-19 LAB — POCT I-STAT, CHEM 8
BUN: 16 mg/dL (ref 6–23)
Calcium, Ion: 1.29 mmol/L — ABNORMAL HIGH (ref 1.12–1.23)
Chloride: 109 mEq/L (ref 96–112)
Creatinine, Ser: 0.8 mg/dL (ref 0.50–1.10)
Glucose, Bld: 157 mg/dL — ABNORMAL HIGH (ref 70–99)
HCT: 37 % (ref 36.0–46.0)
Hemoglobin: 12.6 g/dL (ref 12.0–15.0)
Potassium: 4.4 mEq/L (ref 3.7–5.3)
Sodium: 142 mEq/L (ref 137–147)
TCO2: 21 mmol/L (ref 0–100)

## 2013-11-19 LAB — GLUCOSE, CAPILLARY: Glucose-Capillary: 126 mg/dL — ABNORMAL HIGH (ref 70–99)

## 2013-11-19 SURGERY — CYSTOURETEROSCOPY, WITH STENT INSERTION
Anesthesia: General | Site: Ureter | Laterality: Left

## 2013-11-19 MED ORDER — PROPOFOL 10 MG/ML IV BOLUS
INTRAVENOUS | Status: DC | PRN
  Administered 2013-11-19: 200 mg via INTRAVENOUS

## 2013-11-19 MED ORDER — SENNOSIDES-DOCUSATE SODIUM 8.6-50 MG PO TABS: 1.0000 | ORAL_TABLET | Freq: Two times a day (BID) | ORAL | Status: DC

## 2013-11-19 MED ORDER — OXYCODONE-ACETAMINOPHEN 5-325 MG PO TABS: 1.0000 | ORAL_TABLET | Freq: Four times a day (QID) | ORAL | Status: DC | PRN

## 2013-11-19 MED ORDER — KETOROLAC TROMETHAMINE 30 MG/ML IJ SOLN
INTRAMUSCULAR | Status: DC | PRN
  Administered 2013-11-19: 30 mg via INTRAVENOUS

## 2013-11-19 MED ORDER — FENTANYL CITRATE 0.05 MG/ML IJ SOLN
INTRAMUSCULAR | Status: DC | PRN
Start: 2013-11-19 — End: 2013-11-19
  Administered 2013-11-19 (×2): 25 ug via INTRAVENOUS
  Administered 2013-11-19: 50 ug via INTRAVENOUS
  Administered 2013-11-19: 25 ug via INTRAVENOUS

## 2013-11-19 MED ORDER — OXYCODONE-ACETAMINOPHEN 5-325 MG PO TABS
ORAL_TABLET | ORAL | Status: AC
  Filled 2013-11-19: qty 1

## 2013-11-19 MED ORDER — LACTATED RINGERS IV SOLN
INTRAVENOUS | Status: DC
  Administered 2013-11-19 (×3): via INTRAVENOUS
  Filled 2013-11-19: qty 1000

## 2013-11-19 MED ORDER — FENTANYL CITRATE 0.05 MG/ML IJ SOLN
INTRAMUSCULAR | Status: AC
  Filled 2013-11-19: qty 6

## 2013-11-19 MED ORDER — IOHEXOL 350 MG/ML SOLN
INTRAVENOUS | Status: DC | PRN
  Administered 2013-11-19: 20 mL

## 2013-11-19 MED ORDER — OXYCODONE-ACETAMINOPHEN 5-325 MG PO TABS
1.0000 | ORAL_TABLET | ORAL | Status: DC | PRN
  Administered 2013-11-19: 1 via ORAL
  Filled 2013-11-19: qty 1

## 2013-11-19 MED ORDER — SODIUM CHLORIDE 0.9 % IR SOLN
Status: DC | PRN
  Administered 2013-11-19: 4000 mL

## 2013-11-19 MED ORDER — MIDAZOLAM HCL 5 MG/5ML IJ SOLN
INTRAMUSCULAR | Status: DC | PRN
  Administered 2013-11-19: 2 mg via INTRAVENOUS

## 2013-11-19 MED ORDER — PROMETHAZINE HCL 25 MG/ML IJ SOLN
6.2500 mg | INTRAMUSCULAR | Status: DC | PRN
  Filled 2013-11-19: qty 1

## 2013-11-19 MED ORDER — LIDOCAINE HCL (CARDIAC) 20 MG/ML IV SOLN
INTRAVENOUS | Status: DC | PRN
  Administered 2013-11-19: 60 mg via INTRAVENOUS

## 2013-11-19 MED ORDER — GENTAMICIN IN SALINE 1.6-0.9 MG/ML-% IV SOLN
80.0000 mg | INTRAVENOUS | Status: AC
  Administered 2013-11-19: 400 mg via INTRAVENOUS
  Filled 2013-11-19: qty 50

## 2013-11-19 MED ORDER — FENTANYL CITRATE 0.05 MG/ML IJ SOLN
25.0000 ug | INTRAMUSCULAR | Status: DC | PRN
Start: 2013-11-19 — End: 2013-11-19
  Filled 2013-11-19: qty 1

## 2013-11-19 MED ORDER — MIDAZOLAM HCL 2 MG/2ML IJ SOLN
INTRAMUSCULAR | Status: AC
  Filled 2013-11-19: qty 2

## 2013-11-19 MED ORDER — DEXAMETHASONE SODIUM PHOSPHATE 4 MG/ML IJ SOLN
INTRAMUSCULAR | Status: DC | PRN
  Administered 2013-11-19: 8 mg via INTRAVENOUS

## 2013-11-19 MED ORDER — SULFAMETHOXAZOLE-TRIMETHOPRIM 400-80 MG PO TABS: 1.0000 | ORAL_TABLET | Freq: Two times a day (BID) | ORAL | Status: DC

## 2013-11-19 MED ORDER — ONDANSETRON HCL 4 MG/2ML IJ SOLN
INTRAMUSCULAR | Status: DC | PRN
  Administered 2013-11-19: 4 mg via INTRAVENOUS

## 2013-11-19 SURGICAL SUPPLY — 48 items
BAG DRAIN URO-CYSTO SKYTR STRL (DRAIN) ×2 IMPLANT
BAG DRN UROCATH (DRAIN) ×1
BAG URO CATCHER STRL LF (DRAPE) ×2 IMPLANT
BASKET LASER NITINOL 1.9FR (BASKET) ×2 IMPLANT
BASKET STNLS GEMINI 4WIRE 3FR (BASKET) IMPLANT
BASKET ZERO TIP NITINOL 2.4FR (BASKET) IMPLANT
BSKT STON RTRVL 120 1.9FR (BASKET) ×1
BSKT STON RTRVL GEM 120X11 3FR (BASKET)
BSKT STON RTRVL ZERO TP 2.4FR (BASKET)
CANISTER SUCT LVC 12 LTR MEDI- (MISCELLANEOUS) ×2 IMPLANT
CATH INTERMIT  6FR 70CM (CATHETERS) ×2 IMPLANT
CATH URET 5FR 28IN CONE TIP (BALLOONS)
CATH URET 5FR 28IN OPEN ENDED (CATHETERS) IMPLANT
CATH URET 5FR 70CM CONE TIP (BALLOONS) IMPLANT
CLOTH BEACON ORANGE TIMEOUT ST (SAFETY) ×2 IMPLANT
DRAPE CAMERA CLOSED 9X96 (DRAPES) ×2 IMPLANT
ELECT REM PT RETURN 9FT ADLT (ELECTROSURGICAL)
ELECTRODE REM PT RTRN 9FT ADLT (ELECTROSURGICAL) IMPLANT
FIBER LASER FLEXIVA 200 (UROLOGICAL SUPPLIES) ×1 IMPLANT
FIBER LASER FLEXIVA 365 (UROLOGICAL SUPPLIES) IMPLANT
FIBER LASER TRAC TIP (UROLOGICAL SUPPLIES) IMPLANT
GLOVE BIO SURGEON STRL SZ7 (GLOVE) ×2 IMPLANT
GLOVE BIO SURGEON STRL SZ7.5 (GLOVE) ×2 IMPLANT
GLOVE BIOGEL PI IND STRL 7.5 (GLOVE) IMPLANT
GLOVE BIOGEL PI INDICATOR 7.5 (GLOVE) ×2
GOWN PREVENTION PLUS LG XLONG (DISPOSABLE) ×1 IMPLANT
GOWN PREVENTION PLUS XLARGE (GOWN DISPOSABLE) ×1 IMPLANT
GOWN STRL NON-REIN LRG LVL3 (GOWN DISPOSABLE) ×2 IMPLANT
GOWN STRL REIN XL XLG (GOWN DISPOSABLE) ×1 IMPLANT
GOWN STRL REUS W/TWL XL LVL3 (GOWN DISPOSABLE) ×2 IMPLANT
GUIDEWIRE 0.038 PTFE COATED (WIRE) IMPLANT
GUIDEWIRE ANG ZIPWIRE 038X150 (WIRE) ×2 IMPLANT
GUIDEWIRE STR DUAL SENSOR (WIRE) ×2 IMPLANT
IV NS 1000ML (IV SOLUTION) ×2
IV NS 1000ML BAXH (IV SOLUTION) IMPLANT
IV NS IRRIG 3000ML ARTHROMATIC (IV SOLUTION) ×3 IMPLANT
KIT BALLIN UROMAX 15FX10 (LABEL) IMPLANT
KIT BALLN UROMAX 15FX4 (MISCELLANEOUS) IMPLANT
KIT BALLN UROMAX 26 75X4 (MISCELLANEOUS)
PACK CYSTOSCOPY (CUSTOM PROCEDURE TRAY) ×2 IMPLANT
SET HIGH PRES BAL DIL (LABEL)
SHEATH ACCESS URETERAL 24CM (SHEATH) ×1 IMPLANT
SHEATH URET ACCESS 12FR/35CM (UROLOGICAL SUPPLIES) IMPLANT
SHEATH URET ACCESS 12FR/55CM (UROLOGICAL SUPPLIES) IMPLANT
STENT POLARIS 5FRX24 (STENTS) ×1 IMPLANT
SYRINGE 10CC LL (SYRINGE) ×2 IMPLANT
SYRINGE IRR TOOMEY STRL 70CC (SYRINGE) IMPLANT
TUBE FEEDING 8FR 16IN STR KANG (MISCELLANEOUS) IMPLANT

## 2013-11-19 NOTE — Anesthesia Preprocedure Evaluation (Signed)
Anesthesia Evaluation  Patient identified by MRN, date of birth, ID band Patient awake    Reviewed: Allergy & Precautions, H&P , NPO status , Patient's Chart, lab work & pertinent test results  History of Anesthesia Complications (+) PONV and history of anesthetic complications  Airway Mallampati: II TM Distance: >3 FB Neck ROM: Full    Dental no notable dental hx.    Pulmonary neg pulmonary ROS,  breath sounds clear to auscultation  Pulmonary exam normal       Cardiovascular negative cardio ROS  Rhythm:Regular Rate:Normal     Neuro/Psych negative neurological ROS  negative psych ROS   GI/Hepatic negative GI ROS, Neg liver ROS,   Endo/Other  diabetes, Type 2, Oral Hypoglycemic Agents  Renal/GU Renal disease  negative genitourinary   Musculoskeletal  (+) Arthritis -,   Abdominal   Peds negative pediatric ROS (+)  Hematology negative hematology ROS (+)   Anesthesia Other Findings   Reproductive/Obstetrics negative OB ROS                           Anesthesia Physical Anesthesia Plan  ASA: II  Anesthesia Plan: General   Post-op Pain Management:    Induction: Intravenous  Airway Management Planned: LMA  Additional Equipment:   Intra-op Plan:   Post-operative Plan: Extubation in OR  Informed Consent: I have reviewed the patients History and Physical, chart, labs and discussed the procedure including the risks, benefits and alternatives for the proposed anesthesia with the patient or authorized representative who has indicated his/her understanding and acceptance.   Dental advisory given  Plan Discussed with: CRNA  Anesthesia Plan Comments:         Anesthesia Quick Evaluation

## 2013-11-19 NOTE — Brief Op Note (Signed)
11/19/2013  10:34 AM  PATIENT:  Kimberly Duke  58 y.o. female  PRE-OPERATIVE DIAGNOSIS:  LEFT RENAL STONES, FLANK PAIN  POST-OPERATIVE DIAGNOSIS:  LEFT RENAL STONES, FLANK PAIN  PROCEDURE:  Procedure(s): CYSTOSCOPY WITH URETEROSCOPY , RETROGRADE AND STENT PLACEMENT, STONE EXTRACTION (Left) HOLMIUM LASER APPLICATION (Left)  SURGEON:  Surgeon(s) and Role:    * Sebastian Ache, MD - Primary  PHYSICIAN ASSISTANT:   ASSISTANTS: none   ANESTHESIA:   general  EBL:     BLOOD ADMINISTERED:none  DRAINS: none   LOCAL MEDICATIONS USED:  NONE  SPECIMEN:  Source of Specimen:  Left renal stone fragments  DISPOSITION OF SPECIMEN:  [facility] for compositional analysis  COUNTS:  YES  TOURNIQUET:  * No tourniquets in log *  DICTATION: .Other Dictation: Dictation Number [id number]  PLAN OF CARE: Discharge to home after PACU  PATIENT DISPOSITION:  PACU - hemodynamically stable.   Delay start of Pharmacological VTE agent (>24hrs) due to surgical blood loss or risk of bleeding: yes

## 2013-11-19 NOTE — Anesthesia Procedure Notes (Addendum)
Procedure Name: LMA Insertion Date/Time: 11/19/2013 9:27 AM Performed by: Renella Cunas D Pre-anesthesia Checklist: Patient identified, Emergency Drugs available, Suction available and Patient being monitored Patient Re-evaluated:Patient Re-evaluated prior to inductionOxygen Delivery Method: Circle System Utilized Preoxygenation: Pre-oxygenation with 100% oxygen Intubation Type: IV induction Ventilation: Mask ventilation without difficulty LMA: LMA inserted LMA Size: 4.0 Number of attempts: 1 Airway Equipment and Method: bite block Placement Confirmation: positive ETCO2 Tube secured with: Tape Dental Injury: Teeth and Oropharynx as per pre-operative assessment     Anesthesia Regional Block:   Narrative:    Spinal   Performed by: Felipe Drone    Anesthesia Procedure Note

## 2013-11-19 NOTE — H&P (Signed)
Kimberly Duke is an 58 y.o. female.    Chief Complaint: Pre-OP Left Ureteroscopic Stone Manipulation  HPI:   1 -Recurrent Nephrolithiasis / Left sided Abdominal Pain -  Pre 2015 - SWL x 2 left sided, most recently abroung 1995 or so 10/2013 - several days of colickly left flank pain "feels like prior stones". NO fevers or nausea with emesis. CT Stone with 9mm and 6mm left renal pelvis stone with mild hydro / likely ball-valve effect. 1200HU density. SSD 8cm.   PMH sig for IDDM2, HLD, Benign Hyst.  Today [name] is seen to proceed with left ureteroscopic stone manipulation for goal of stone free. Most recent UCX negative.   Past Medical History  Diagnosis Date  . Hyperlipidemia   . Osteoporosis   . Type 2 diabetes mellitus   . Renal calculus, left   . GERD (gastroesophageal reflux disease)     without acid reflux  . History of hypothyroidism   . Thyroid nodule     monitored by dr balan-  negartive bx's  . History of kidney stones   . History of endometriosis   . Wears glasses   . Upper airway cough syndrome     pulmologist-   dr wert  . Arthritis   . PONV (postoperative nausea and vomiting)     Past Surgical History  Procedure Laterality Date  . Tubal ligation  yrs ago  . Total abdominal hysterectomy w/ bilateral salpingoophorectomy  1997  . Extracorporeal shock wave lithotripsy Left x2  last one 1990's  . Ureteral reimplantion Right 1993 (approx)    w/  endometriosis resectED from ureter    Family History  Problem Relation Age of Onset  . Diabetes Mother   . Hyperlipidemia Mother   . COPD Father     smoked  . Diabetes Sister   . Breast cancer Paternal Grandmother   . Lung cancer Paternal Grandfather     smoked   Social History:  reports that she has never smoked. She has never used smokeless tobacco. She reports that she does not drink alcohol or use illicit drugs.  Allergies:  Allergies  Allergen Reactions  . Amoxicillin Hives  . Darvon Nausea And  Vomiting    No prescriptions prior to admission    No results found for this or any previous visit (from the past 48 hour(s)). No results found.  Review of Systems  Constitutional: Negative.  Negative for fever and chills.  HENT: Negative.   Eyes: Negative.   Respiratory: Negative.   Cardiovascular: Negative.   Gastrointestinal: Negative.  Negative for nausea and vomiting.  Genitourinary: Positive for flank pain.  Musculoskeletal: Negative.   Skin: Negative.   Neurological: Negative.   Endo/Heme/Allergies: Negative.   Psychiatric/Behavioral: Negative.     Height 5' 6.5" (1.689 m), weight 79.379 kg (175 lb). Physical Exam  Constitutional: She is oriented to person, place, and time. She appears well-developed and well-nourished.  HENT:  Head: Normocephalic.  Eyes: Pupils are equal, round, and reactive to light.  Neck: Normal range of motion. Neck supple.  Cardiovascular: Normal rate.   Respiratory: Effort normal.  GI: Soft.  Genitourinary:  Very mild left CVAT  Musculoskeletal: Normal range of motion.  Neurological: She is alert and oriented to person, place, and time.  Skin: Skin is warm and dry.  Psychiatric: She has a normal mood and affect. Her behavior is normal. Judgment and thought content normal.     Assessment/Plan  1 - Nephrolithiasis / Left sided Abdominal Pain -   likely 2/2 left UPJ stone. URS first choice given size, density. Also dicussed MET and SWL.   We rediscussed ureteroscopic stone manipulation with basketing and laser-lithotripsy in detail.  We rediscussed risks including bleeding, infection, damage to kidney / ureter  bladder, rarely loss of kidney. We rediscussed anesthetic risks and rare but serious surgical complications including DVT, PE, MI, and mortality. We specifically readdressed that in 5-10% of cases a staged approach is required with stenting followed by re-attempt ureteroscopy if anatomy unfavorable. The patient voiced understanding and  wises to proceed today as planned.   Kimberly Duke, Kimberly Duke 11/19/2013, 6:04 AM    

## 2013-11-19 NOTE — Transfer of Care (Signed)
Immediate Anesthesia Transfer of Care Note  Patient: Kimberly Duke  Procedure(s) Performed: Procedure(s) (LRB): CYSTOSCOPY WITH URETEROSCOPY , RETROGRADE AND STENT PLACEMENT, STONE EXTRACTION (Left) HOLMIUM LASER APPLICATION (Left)  Patient Location: PACU  Anesthesia Type: General  Level of Consciousness: awake, oriented, sedated and patient cooperative  Airway & Oxygen Therapy: Patient Spontanous Breathing and Patient connected to face mask oxygen  Post-op Assessment: Report given to PACU RN and Post -op Vital signs reviewed and stable  Post vital signs: Reviewed and stable  Complications: No apparent anesthesia complications

## 2013-11-19 NOTE — Discharge Instructions (Signed)
1 - You may have urinary urgency (bladder spasms) and bloody urine on / off with stent in place. This is normal. ° °2 - Call MD or go to ER for fever >102, severe pain / nausea / vomiting not relieved by medications, or acute change in medical status °[facility] °[phone] °Post Ureteroscopy With or Without Stent Instructions ° °Definitions: ° °Ureter: The duct that transports urine from the kidney to the bladder. °Stent:   A plastic hollow tube that is placed into the ureter, from the kidney to the                 bladder to prevent the ureter from swelling shut. ° °GENERAL INSTRUCTIONS: ° °Despite the fact that no skin incisions were used, the area around the ureter and bladder is raw and irritated. The stent is a foreign body which will further irritate the bladder wall. This irritation is manifested by increased frequency of urination, both day and night, and by an increase in the urge to urinate. In some, the urge to urinate is present almost always. Sometimes the urge is strong enough that you may not be able to stop yourself from urinating. The only real cure is to remove the stent and then give time for the bladder wall to heal which can't be done until the danger of the ureter swelling shut has passed, which varies. ° °You may see some blood in your urine while the stent is in place and a few days afterwards. Do not be alarmed, even if the urine was clear for a while. Get off your feet and drink lots of fluids until clearing occurs. If you start to pass clots or don't improve, call us. ° °DIET: °You may return to your normal diet immediately. Because of the raw surface of your bladder, alcohol, spicy foods, acid type foods and drinks with caffeine may cause irritation or frequency and should be used in moderation. To keep your urine flowing freely and to avoid constipation, drink plenty of fluids during the day ( 8-10 glasses ). °Tip: Avoid cranberry juice because it is very  acidic. ° °ACTIVITY: °Your physical activity doesn't need to be restricted. However, if you are very active, you may see some blood in your urine. We suggest that you reduce your activity under these circumstances until the bleeding has stopped. ° °BOWELS: °It is important to keep your bowels regular during the postoperative period. Straining with bowel movements can cause bleeding. A bowel movement every other day is reasonable. Use a mild laxative if needed, such as Milk of Magnesia 2-3 tablespoons, or 2 Dulcolax tablets. Call if you continue to have problems. If you have been taking narcotics for pain, before, during or after your surgery, you may be constipated. Take a laxative if necessary. ° ° °MEDICATION: °You should resume your pre-surgery medications unless told not to. In addition you will often be given an antibiotic to prevent infection. These should be taken as prescribed until the bottles are finished unless you are having an unusual reaction to one of the drugs. ° °PROBLEMS YOU SHOULD REPORT TO US: °· Fevers over 100.5 Fahrenheit. °· Heavy bleeding, or clots ( See above notes about blood in urine ). °· Inability to urinate. °· Drug reactions ( hives, rash, nausea, vomiting, diarrhea ). °· Severe burning or pain with urination that is not improving. ° °FOLLOW-UP: °You will need a follow-up appointment to monitor your progress. Call for this appointment at the number listed above.   Usually the first appointment will be about three to fourteen days after your surgery. ° ° ° ° ° °Post Anesthesia Home Care Instructions ° °Activity: °Get plenty of rest for the remainder of the day. A responsible adult should stay with you for 24 hours following the procedure.  °For the next 24 hours, DO NOT: °-Drive a car °-Operate machinery °-Drink alcoholic beverages °-Take any medication unless instructed by your physician °-Make any legal decisions or sign important papers. ° °Meals: °Start with liquid foods such as  gelatin or soup. Progress to regular foods as tolerated. Avoid greasy, spicy, heavy foods. If nausea and/or vomiting occur, drink only clear liquids until the nausea and/or vomiting subsides. Call your physician if vomiting continues. ° °Special Instructions/Symptoms: °Your throat may feel dry or sore from the anesthesia or the breathing tube placed in your throat during surgery. If this causes discomfort, gargle with warm salt water. The discomfort should disappear within 24 hours. ° °

## 2013-11-19 NOTE — Anesthesia Postprocedure Evaluation (Signed)
  Anesthesia Post-op Note  Patient: Kimberly Duke  Procedure(s) Performed: Procedure(s) (LRB): CYSTOSCOPY WITH URETEROSCOPY , RETROGRADE AND STENT PLACEMENT, STONE EXTRACTION (Left) HOLMIUM LASER APPLICATION (Left)  Patient Location: PACU  Anesthesia Type: general  Level of Consciousness: awake and alert   Airway and Oxygen Therapy: Patient Spontanous Breathing  Post-op Pain: mild  Post-op Assessment: Post-op Vital signs reviewed, Patient's Cardiovascular Status Stable, Respiratory Function Stable, Patent Airway and No signs of Nausea or vomiting  Last Vitals:  Filed Vitals:   11/19/13 1304  BP: 141/66  Pulse: 61  Temp: 36.7 C  Resp: 16    Post-op Vital Signs: stable   Complications: No apparent anesthesia complications

## 2013-11-20 ENCOUNTER — Encounter (HOSPITAL_BASED_OUTPATIENT_CLINIC_OR_DEPARTMENT_OTHER): Payer: Self-pay | Admitting: Urology

## 2013-11-20 NOTE — Op Note (Signed)
NAMEWENDY, Kimberly Duke NO.:  [id number]  MEDICAL RECORD NO.:  [id number]  LOCATION:                                 FACILITY:  PHYSICIAN:  Sebastian Ache, MD     DATE OF BIRTH:  [birth year]  DATE OF PROCEDURE:  11/19/2013 DATE OF DISCHARGE:  11/19/2013                              OPERATIVE REPORT   DIAGNOSIS:  Multifocal large left renal stones with left flank pain.  PROCEDURE: 1. Cystoscopy with left retrograde pyelogram interpretation. 2. Left ureteroscopy with laser lithotripsy. 3. Insertion of left ureteral stent 5 x 24, no tether.  ESTIMATED BLOOD LOSS:  Nil.  COMPLICATIONS:  None.  ASSESSMENT:  Left renal stone fragments for compositional analysis.  FINDINGS: 1. Slight relative narrowing at the area of the left UPJ without frank     obstruction or stricture.  This area easily accommodated the 8-     [place] digital ureteroscope and 2 wires. 2. One large upper pole and one smaller lower pole stone, total stone     volume approximately 1.2 cm2. 3. Complete resolution of all stone fragments larger than one-third     millimeter following laser lithotripsy and stone fragmentation.  INDICATIONS:  Kimberly Duke is a very pleasant 58 year old lady who was found on workup of colicky left flank pain to have multifocal left renal stones.  These appear to be having a ball-valving effect radiographically.  Options were discussed for management including observation versus shockwave lithotripsy versus ureteroscopy, and she wished to proceed with the latter.  Informed consent was obtained and placed in medical record.  PROCEDURE IN DETAIL:  The patient being Kimberly Duke verified, procedure being left ureteroscopic stone manipulation was confirmed. Procedure was carried out.  Time-out was performed.  Intravenous antibiotics were administered.  General LMA anesthesia was introduced. Patient placed into a low lithotomy position.  Sterile field was created by  prepping and draping the patient's vagina, introitus, and proximal thighs using iodine x3.  Next, cystourethroscopy was performed using a 22-French rigid scope 12-degree offset lens.  Inspection of the urinary bladder revealed no diverticula, calcifications, or papular lesions. The left ureteral orifice was cannulated with a 6-French end-hole catheter and left retrograde pyelogram was obtained.  Left retrograde pyelogram demonstrated single left ureter, single system left kidney.  There was some relative narrowing in the area of the UPJ, however, contrast easily went back and forth.  There was no significant hydroureteronephrosis.  There were 2 calcifications overlying the shadow of left kidney, a larger upper pole stone and a smaller lower pole stone.  A 0.038 Glidewire was advanced at the level of the upper pole and set aside as a safety wire.  Next, semi-rigid ureteroscopy performed in the entire length of left ureter alongside a separate Sensor working wire.  As expected in the area of the UPJ, there was some relative narrowing; however, there was no frank stricture in this location.  The semi-rigid ureteroscope was then exchanged with the 12/14, 24 cm ureteral access sheath at the level of proximal ureter and flexible digital ureteroscopy was performed of the proximal ureter.  Systematic inspection of the left kidney using 8-French digital ureteroscope, again the  area of the UPJ easily accommodated the scope as well as 2 wires. On inspection of the kidney x2, revealed multifocal stones, a larger upper pole stone approximately 9 mm or so and lower pole stone that was smaller.  These appeared to be much too large for simple basketing.  As such, holmium laser energy was applied to both stones using a dusting technique, which completely ablated approximately 50% of the stone volume.  Next, each fragment was purposefully fractured into pieces of 3 mm or so in diameter.  These were then  each grasped with an escape type basket and brought out in their entirety, set aside for compositional analysis.  Following these maneuvers, hemostasis appeared excellent. There was no evidence of perforation.  Also fragments larger than one- third millimeter had been removed.  There was, as suspected, some stone dust given the dusting technique, however, it was felt that the individual particles were quite small and these were easily flushed with interval stenting as planned.  The ureteral access sheath was then removed under continuous ureteroscopic vision and no mucosal abnormalities were found.  Finally, a new 5 x 24 Polaris-type stent was placed using fluoroscopic guidance with the proximal curl in the renal pelvis heading into the lower pole, the distal end of the urinary bladder.  The bladder was emptied per cystoscope.  Procedure was then terminated.  The patient tolerated the procedure well.  There were no immediate periprocedural complications.  The patient was taken to postanesthesia care unit in stable condition.          ______________________________ Sebastian Ache, MD     TM/MEDQ  D:  11/19/2013  T:  11/20/2013  Job:  [id number]

## 2013-11-23 ENCOUNTER — Other Ambulatory Visit: Payer: Self-pay | Admitting: Internal Medicine

## 2014-08-08 ENCOUNTER — Ambulatory Visit (INDEPENDENT_AMBULATORY_CARE_PROVIDER_SITE_OTHER): Payer: 59 | Admitting: Physician Assistant

## 2014-08-08 VITALS — BP 132/70 | HR 89 | Temp 98.2°F | Resp 18 | Ht 65.5 in | Wt 173.0 lb

## 2014-08-08 DIAGNOSIS — R102 Pelvic and perineal pain: Secondary | ICD-10-CM | POA: Diagnosis not present

## 2014-08-08 DIAGNOSIS — J40 Bronchitis, not specified as acute or chronic: Secondary | ICD-10-CM

## 2014-08-08 DIAGNOSIS — R05 Cough: Secondary | ICD-10-CM | POA: Diagnosis not present

## 2014-08-08 DIAGNOSIS — R059 Cough, unspecified: Secondary | ICD-10-CM

## 2014-08-08 LAB — POCT UA - MICROSCOPIC ONLY
Amorphous: POSITIVE
Casts, Ur, LPF, POC: NEGATIVE
Crystals, Ur, HPF, POC: NEGATIVE
Yeast, UA: NEGATIVE

## 2014-08-08 LAB — POCT URINALYSIS DIPSTICK
Bilirubin, UA: NEGATIVE
Blood, UA: NEGATIVE
Glucose, UA: NEGATIVE
Ketones, UA: NEGATIVE
Leukocytes, UA: NEGATIVE
Nitrite, UA: NEGATIVE
Protein, UA: NEGATIVE
Spec Grav, UA: 1.015
Urobilinogen, UA: 0.2
pH, UA: 5

## 2014-08-08 MED ORDER — BENZONATATE 100 MG PO CAPS: 100.0000 mg | ORAL_CAPSULE | Freq: Three times a day (TID) | ORAL | Status: DC | PRN

## 2014-08-08 MED ORDER — IPRATROPIUM BROMIDE 0.02 % IN SOLN
0.5000 mg | Freq: Once | RESPIRATORY_TRACT | Status: AC
  Administered 2014-08-08: 0.5 mg via RESPIRATORY_TRACT

## 2014-08-08 MED ORDER — GUAIFENESIN ER 1200 MG PO TB12: 1.0000 | ORAL_TABLET | Freq: Two times a day (BID) | ORAL | Status: DC | PRN

## 2014-08-08 MED ORDER — IPRATROPIUM BROMIDE HFA 17 MCG/ACT IN AERS: 2.0000 | INHALATION_SPRAY | Freq: Four times a day (QID) | RESPIRATORY_TRACT | Status: DC | PRN

## 2014-08-08 NOTE — Patient Instructions (Signed)
Please make sure that you are drinking water (64oz, or about 4 regular sized water bottles).  Acute Bronchitis Bronchitis is inflammation of the airways that extend from the windpipe into the lungs (bronchi). The inflammation often causes mucus to develop. This leads to a cough, which is the most common symptom of bronchitis.  In acute bronchitis, the condition usually develops suddenly and goes away over time, usually in a couple weeks. Smoking, allergies, and asthma can make bronchitis worse. Repeated episodes of bronchitis may cause further lung problems.  CAUSES Acute bronchitis is most often caused by the same virus that causes a cold. The virus can spread from person to person (contagious) through coughing, sneezing, and touching contaminated objects. SIGNS AND SYMPTOMS   Cough.   Fever.   Coughing up mucus.   Body aches.   Chest congestion.   Chills.   Shortness of breath.   Sore throat.  DIAGNOSIS  Acute bronchitis is usually diagnosed through a physical exam. Your health care provider will also ask you questions about your medical history. Tests, such as chest X-rays, are sometimes done to rule out other conditions.  TREATMENT  Acute bronchitis usually goes away in a couple weeks. Oftentimes, no medical treatment is necessary. Medicines are sometimes given for relief of fever or cough. Antibiotic medicines are usually not needed but may be prescribed in certain situations. In some cases, an inhaler may be recommended to help reduce shortness of breath and control the cough. A cool mist vaporizer may also be used to help thin bronchial secretions and make it easier to clear the chest.  HOME CARE INSTRUCTIONS  Get plenty of rest.   Drink enough fluids to keep your urine clear or pale yellow (unless you have a medical condition that requires fluid restriction). Increasing fluids may help thin your respiratory secretions (sputum) and reduce chest congestion, and it will  prevent dehydration.   Take medicines only as directed by your health care provider.  If you were prescribed an antibiotic medicine, finish it all even if you start to feel better.  Avoid smoking and secondhand smoke. Exposure to cigarette smoke or irritating chemicals will make bronchitis worse. If you are a smoker, consider using nicotine gum or skin patches to help control withdrawal symptoms. Quitting smoking will help your lungs heal faster.   Reduce the chances of another bout of acute bronchitis by washing your hands frequently, avoiding people with cold symptoms, and trying not to touch your hands to your mouth, nose, or eyes.   Keep all follow-up visits as directed by your health care provider.  SEEK MEDICAL CARE IF: Your symptoms do not improve after 1 week of treatment.  SEEK IMMEDIATE MEDICAL CARE IF:  You develop an increased fever or chills.   You have chest pain.   You have severe shortness of breath.  You have bloody sputum.   You develop dehydration.  You faint or repeatedly feel like you are going to pass out.  You develop repeated vomiting.  You develop a severe headache. MAKE SURE YOU:   Understand these instructions.  Will watch your condition.  Will get help right away if you are not doing well or get worse. Document Released: 04/13/2004 Document Revised: 07/21/2013 Document Reviewed: 08/27/2012 [facility] Patient Information 2015 [employer] [place]. This information is not intended to replace advice given to you by your health care provider. Make sure you discuss any questions you have with your health care provider.

## 2014-08-08 NOTE — Progress Notes (Signed)
[facility] [address], [place] [address] [phone]  Date:  08/08/2014   Name:  Kimberly Duke   DOB:  03-10-1956   MRN:  [id number]  PCP:  Redmond Baseman, MD    Chief Complaint: Cough   History of Present Illness:  Kimberly Duke is a 59 y.o. very pleasant female patient who presents with the following:  Patient is here for a cough that has been present for 4 days.  It is non-productive, but feels like something is lodged in her throat.  She has no fever, chills, or fatigue.  She has minor nasal congestion.  Denies sob or dyspnea.  Sick contact at home with daughter with similar symptoms, however improved within days.   She also complains of abdominal pain in her lower quadrant.  She denies dysuria, hematuria, or frequency.  She has no abnormal vaginal discharge or odor.  There is no bleeding.  She has no constipation or diarrhea.  She denies back pain.  Symptoms are not similar to her hx of kidney stone pain, she states.   Patient Active Problem List   Diagnosis Date Noted  . Upper airway cough syndrome 04/10/2013  . Diabetes mellitus 06/19/2011    Past Medical History  Diagnosis Date  . Hyperlipidemia   . Osteoporosis   . Type 2 diabetes mellitus   . Renal calculus, left   . GERD (gastroesophageal reflux disease)     without acid reflux  . History of hypothyroidism   . Thyroid nodule     monitored by dr balan-  negartive bx's  . History of kidney stones   . History of endometriosis   . Wears glasses   . Upper airway cough syndrome     pulmologist-   dr wert  . Arthritis   . PONV (postoperative nausea and vomiting)     Past Surgical History  Procedure Laterality Date  . Tubal ligation  yrs ago  . Total abdominal hysterectomy w/ bilateral salpingoophorectomy  1997  . Extracorporeal shock wave lithotripsy Left x2  last one 1990's  . Ureteral reimplantion Right 1993 (approx)    w/  endometriosis resectED from ureter  . Cystoscopy  with ureteroscopy and stent placement Left 11/19/2013    Procedure: CYSTOSCOPY WITH URETEROSCOPY , RETROGRADE AND STENT PLACEMENT, STONE EXTRACTION;  Surgeon: Sebastian Ache, MD;  Location: [facility];  Service: Urology;  Laterality: Left;  . Holmium laser application Left 11/19/2013    Procedure: HOLMIUM LASER APPLICATION;  Surgeon: Sebastian Ache, MD;  Location: [facility];  Service: Urology;  Laterality: Left;    History  Substance Use Topics  . Smoking status: Never Smoker   . Smokeless tobacco: Never Used  . Alcohol Use: No    Family History  Problem Relation Age of Onset  . Diabetes Mother   . Hyperlipidemia Mother   . COPD Father     smoked  . Diabetes Sister   . Breast cancer Paternal Grandmother   . Cancer Paternal Grandmother     breast cancer (older onset)  . Lung cancer Paternal Grandfather     smoked  . Cancer Paternal Grandfather     lung (smoker)  . Diabetes Brother   . Hypertension Brother   . Cancer Maternal Grandmother 48    breast cancer  . Cancer Maternal Grandfather     lung cancer    Allergies  Allergen Reactions  . Amoxicillin Hives  . Darvon Nausea And  Vomiting    Medication list has been reviewed and updated.  Current Outpatient Prescriptions on File Prior to Visit  Medication Sig Dispense Refill  . fenofibrate 160 MG tablet Take 160 mg by mouth daily.    Marland Kitchen glimepiride (AMARYL) 4 MG tablet Take 4 mg by mouth 2 (two) times daily.    Marland Kitchen ibuprofen (ADVIL,MOTRIN) 200 MG tablet Take 200 mg by mouth every 6 (six) hours as needed.    . insulin glargine (LANTUS) 100 UNIT/ML injection Inject 30 Units into the skin at bedtime.     . metFORMIN (GLUCOPHAGE) 500 MG tablet Take 1,000 mg by mouth 2 (two) times daily.     . Multiple Vitamin (MULTIVITAMIN) tablet Take 1 tablet by mouth daily.    . psyllium (METAMUCIL) 58.6 % powder Take 1 packet by mouth daily.     No current facility-administered medications on file prior to  visit.    Review of Systems: ROS otherwise unremarkable unless listed above.  Physical Examination: Filed Vitals:   08/08/14 0822  BP: 132/70  Pulse: 89  Temp: 98.2 F (36.8 C)  Resp: 18   Filed Vitals:   08/08/14 0822  Height: 5' 5.5" (1.664 m)  Weight: 173 lb (78.472 kg)   Body mass index is 28.34 kg/(m^2). Ideal Body Weight: Weight in (lb) to have BMI = 25: 152.2  Physical Exam  Constitutional: She appears well-nourished.  HENT:  Right Ear: Tympanic membrane, external ear and ear canal normal.  Left Ear: Tympanic membrane, external ear and ear canal normal.  Nose: Rhinorrhea present. No mucosal edema. Right sinus exhibits no maxillary sinus tenderness and no frontal sinus tenderness. Left sinus exhibits no maxillary sinus tenderness and no frontal sinus tenderness.  Mouth/Throat: No uvula swelling. No oropharyngeal exudate, posterior oropharyngeal edema or posterior oropharyngeal erythema.  Cardiovascular: Normal rate and regular rhythm.  Exam reveals no gallop and no friction rub.   No murmur heard. Pulmonary/Chest: No respiratory distress. She has no decreased breath sounds. She has no wheezes. She has rhonchi (Throughout lungs).  Abdominal: Soft. Normal appearance and bowel sounds are normal. There is tenderness in the right lower quadrant, suprapubic area and left lower quadrant. There is no CVA tenderness.   Results for orders placed or performed in visit on 08/08/14  POCT UA - Microscopic Only  Result Value Ref Range   WBC, Ur, HPF, POC 0-4    RBC, urine, microscopic 0-2    Bacteria, U Microscopic trace    Mucus, UA trace    Epithelial cells, urine per micros 0-3    Crystals, Ur, HPF, POC neg    Casts, Ur, LPF, POC neg    Yeast, UA neg    Amorphous pos   POCT urinalysis dipstick  Result Value Ref Range   Color, UA yellow    Clarity, UA clear    Glucose, UA neg    Bilirubin, UA neg    Ketones, UA neg    Spec Grav, UA 1.015    Blood, UA neg    pH, UA 5.0     Protein, UA neg    Urobilinogen, UA 0.2    Nitrite, UA neg    Leukocytes, UA Negative    Peak flow with increase post nebulizer 200-->330  Assessment and Plan: 59 year old female is here today for chief complaint of cough, and pelvic pain. -Bronchitis without infection, given inhaler and supportive treatment. -Urine culture placed.  Cough - Plan: benzonatate (TESSALON) 100 MG capsule, Guaifenesin (MUCINEX MAXIMUM  STRENGTH) 1200 MG TB12, ipratropium (ATROVENT) nebulizer solution 0.5 mg  Pelvic pain in female - Plan: POCT UA - Microscopic Only, POCT urinalysis dipstick, Urine culture, ipratropium (ATROVENT) nebulizer solution 0.5 mg, Urine culture  Bronchitis - Plan: ipratropium (ATROVENT HFA) 17 MCG/ACT inhaler, ipratropium (ATROVENT) nebulizer solution 0.5 mg   Trena PlattStephanie Kenyah Luba, PA-C [facility] 5/21/201610:17 AM

## 2014-08-10 LAB — URINE CULTURE

## 2014-08-12 ENCOUNTER — Ambulatory Visit (INDEPENDENT_AMBULATORY_CARE_PROVIDER_SITE_OTHER): Payer: 59 | Admitting: Urgent Care

## 2014-08-12 VITALS — BP 142/68 | HR 100 | Temp 99.0°F | Resp 16 | Ht 65.5 in | Wt 173.0 lb

## 2014-08-12 DIAGNOSIS — R05 Cough: Secondary | ICD-10-CM | POA: Diagnosis not present

## 2014-08-12 DIAGNOSIS — R0989 Other specified symptoms and signs involving the circulatory and respiratory systems: Secondary | ICD-10-CM | POA: Diagnosis not present

## 2014-08-12 DIAGNOSIS — R030 Elevated blood-pressure reading, without diagnosis of hypertension: Secondary | ICD-10-CM | POA: Diagnosis not present

## 2014-08-12 DIAGNOSIS — J029 Acute pharyngitis, unspecified: Secondary | ICD-10-CM

## 2014-08-12 DIAGNOSIS — R0789 Other chest pain: Secondary | ICD-10-CM

## 2014-08-12 DIAGNOSIS — R059 Cough, unspecified: Secondary | ICD-10-CM

## 2014-08-12 DIAGNOSIS — J209 Acute bronchitis, unspecified: Secondary | ICD-10-CM | POA: Diagnosis not present

## 2014-08-12 MED ORDER — AZITHROMYCIN 500 MG PO TABS: 500.0000 mg | ORAL_TABLET | Freq: Every day | ORAL | Status: DC

## 2014-08-12 MED ORDER — HYDROCODONE-HOMATROPINE 5-1.5 MG/5ML PO SYRP: 5.0000 mL | ORAL_SOLUTION | Freq: Every evening | ORAL | Status: DC | PRN

## 2014-08-12 NOTE — Progress Notes (Signed)
    MRN: [id number] DOB: 03/15/1956  Subjective:   Kimberly Duke is a 59 y.o. female presenting for chief complaint of Follow-up  Reports 2 week history of cough, sore throat, chest congestion now having lymph node pain, bilateral ear pain, subjective fever. Still has chest congestion, chest tightness. Patient was [facility] at [facility] on 08/08/2014, managed conservatively. She has been taking meds as prescribed including Tessalon pearles, Mucinex, atrovent inhaler, robitussin at night but has not had any improvement/relief with this. Denies chest pain, shob, wheezing, n/v, abdominal pain, ear drainage, productive cough, hemoptysis. Denies history of asthma, takes Claritin daily for seasonal allergies. Denies smoking, alcohol use. Denies any other aggravating or relieving factors, no other questions or concerns.  [name] has a current medication list which includes the following prescription(s): benzonatate, fenofibrate, glimepiride, guaifenesin, ibuprofen, insulin glargine, ipratropium, loratadine, metformin, multivitamin, and psyllium. She is allergic to amoxicillin and darvon.  [name]  has a past medical history of Hyperlipidemia; Osteoporosis; Type 2 diabetes mellitus; Renal calculus, left; GERD (gastroesophageal reflux disease); History of hypothyroidism; Thyroid nodule; History of kidney stones; History of endometriosis; Wears glasses; Upper airway cough syndrome; Arthritis; and PONV (postoperative nausea and vomiting). Also  has past surgical history that includes Tubal ligation (yrs ago); Total abdominal hysterectomy w/ bilateral salpingoophorectomy (1997); Extracorporeal shock wave lithotripsy (Left, x2  last one 1990's); Ureteral reimplantion (Right, 1993 (approx)); Cystoscopy with ureteroscopy and stent placement (Left, 11/19/2013); and Holmium laser application (Left, 11/19/2013).  ROS As in subjective.  Objective:   Vitals: BP 142/68 mmHg  Pulse 100  Temp(Src) 99 F  (37.2 C) (Oral)  Resp 16  Ht 5' 5.5" (1.664 m)  Wt 173 lb (78.472 kg)  BMI 28.34 kg/m2  SpO2 98%  Physical Exam  Constitutional: She appears well-developed and well-nourished.  HENT:  TM's flat bilaterally, no effusions or erythema. Nasal turbinates pink and moist. Mild frontal sinus tenderness. Mild postnasal drip present, without oropharyngeal exudates, erythema or abscesses.   Eyes: Conjunctivae are normal. Right eye exhibits no discharge. Left eye exhibits no discharge. No scleral icterus.  Cardiovascular: Normal rate, regular rhythm and intact distal pulses.  Exam reveals no gallop and no friction rub.   No murmur heard. Pulmonary/Chest: No stridor. No respiratory distress. She has no wheezes. She has no rales. She exhibits no tenderness.  Coarse lung sounds at lower bases. No dullness to percussion.  Lymphadenopathy:    She has cervical adenopathy (bilateral, anterior).  Skin: Skin is warm and dry. No rash noted. No erythema. No pallor.   Assessment and Plan :   1. Acute bronchitis, unspecified organism 2. Cough 3. Chest congestion 4. Chest tightness 5. Sore throat - Will start azithromycin course, Hycodan at night, continue Tessalon, stop Mucinex and Atrovent inhaler. Return to clinic in 1 week if no improvement in symptoms.  6. Elevated blood pressure reading without diagnosis of hypertension - Continue to monitor. Patient denies history of Hypertension.  Kimberly BambergMario Camira Geidel, PA-C [facility] [phone] 08/12/2014 5:07 PM

## 2014-08-12 NOTE — Patient Instructions (Signed)

## 2014-09-14 ENCOUNTER — Other Ambulatory Visit: Payer: Self-pay

## 2014-09-25 ENCOUNTER — Ambulatory Visit (INDEPENDENT_AMBULATORY_CARE_PROVIDER_SITE_OTHER): Payer: 59 | Admitting: Urgent Care

## 2014-09-25 VITALS — BP 118/68 | HR 106 | Temp 99.2°F | Resp 20 | Ht 66.5 in | Wt 172.0 lb

## 2014-09-25 DIAGNOSIS — N3090 Cystitis, unspecified without hematuria: Secondary | ICD-10-CM | POA: Insufficient documentation

## 2014-09-25 DIAGNOSIS — R109 Unspecified abdominal pain: Secondary | ICD-10-CM | POA: Diagnosis not present

## 2014-09-25 DIAGNOSIS — Z87442 Personal history of urinary calculi: Secondary | ICD-10-CM

## 2014-09-25 DIAGNOSIS — Z87440 Personal history of urinary (tract) infections: Secondary | ICD-10-CM

## 2014-09-25 LAB — POCT UA - MICROSCOPIC ONLY
Casts, Ur, LPF, POC: NEGATIVE
Crystals, Ur, HPF, POC: NEGATIVE
Mucus, UA: POSITIVE
Yeast, UA: NEGATIVE

## 2014-09-25 LAB — POCT URINALYSIS DIPSTICK
Blood, UA: NEGATIVE
Glucose, UA: NEGATIVE
Ketones, UA: NEGATIVE
Leukocytes, UA: NEGATIVE
Nitrite, UA: NEGATIVE
Protein, UA: 30
Spec Grav, UA: 1.025
Urobilinogen, UA: 1
pH, UA: 6

## 2014-09-25 MED ORDER — NITROFURANTOIN MONOHYD MACRO 100 MG PO CAPS: 100.0000 mg | ORAL_CAPSULE | Freq: Two times a day (BID) | ORAL | Status: DC

## 2014-09-25 NOTE — Patient Instructions (Signed)
Urinary Tract Infection  Urinary tract infections (UTIs) can develop anywhere along your urinary tract. Your urinary tract is your body's drainage system for removing wastes and extra water. Your urinary tract includes two kidneys, two ureters, a bladder, and a urethra. Your kidneys are a pair of bean-shaped organs. Each kidney is about the size of your fist. They are located below your ribs, one on each side of your spine.  CAUSES  Infections are caused by microbes, which are microscopic organisms, including fungi, viruses, and bacteria. These organisms are so small that they can only be seen through a microscope. Bacteria are the microbes that most commonly cause UTIs.  SYMPTOMS   Symptoms of UTIs may vary by age and gender of the patient and by the location of the infection. Symptoms in young women typically include a frequent and intense urge to urinate and a painful, burning feeling in the bladder or urethra during urination. Older women and men are more likely to be tired, shaky, and weak and have muscle aches and abdominal pain. A fever may mean the infection is in your kidneys. Other symptoms of a kidney infection include pain in your back or sides below the ribs, nausea, and vomiting.  DIAGNOSIS  To diagnose a UTI, your caregiver will ask you about your symptoms. Your caregiver also will ask to provide a urine sample. The urine sample will be tested for bacteria and white blood cells. White blood cells are made by your body to help fight infection.  TREATMENT   Typically, UTIs can be treated with medication. Because most UTIs are caused by a bacterial infection, they usually can be treated with the use of antibiotics. The choice of antibiotic and length of treatment depend on your symptoms and the type of bacteria causing your infection.  HOME CARE INSTRUCTIONS   If you were prescribed antibiotics, take them exactly as your caregiver instructs you. Finish the medication even if you feel better after you  have only taken some of the medication.   Drink enough water and fluids to keep your urine clear or pale yellow.   Avoid caffeine, tea, and carbonated beverages. They tend to irritate your bladder.   Empty your bladder often. Avoid holding urine for long periods of time.   Empty your bladder before and after sexual intercourse.   After a bowel movement, women should cleanse from front to back. Use each tissue only once.  SEEK MEDICAL CARE IF:    You have back pain.   You develop a fever.   Your symptoms do not begin to resolve within 3 days.  SEEK IMMEDIATE MEDICAL CARE IF:    You have severe back pain or lower abdominal pain.   You develop chills.   You have nausea or vomiting.   You have continued burning or discomfort with urination.  MAKE SURE YOU:    Understand these instructions.   Will watch your condition.   Will get help right away if you are not doing well or get worse.  Document Released: 12/14/2004 Document Revised: 09/05/2011 Document Reviewed: 04/14/2011  ExitCare Patient Information 2015 [employer]. This information is not intended to replace advice given to you by your health care provider. Make sure you discuss any questions you have with your health care provider.          Kidney Stones  Kidney stones (urolithiasis) are deposits that form inside your kidneys. The intense pain is caused by the stone moving through the urinary tract. When   the stone moves, the ureter goes into spasm around the stone. The stone is usually passed in the urine.   CAUSES    A disorder that makes certain neck glands produce too much parathyroid hormone (primary hyperparathyroidism).   A buildup of uric acid crystals, similar to gout in your joints.   Narrowing (stricture) of the ureter.   A kidney obstruction present at birth (congenital obstruction).   Previous surgery on the kidney or ureters.   Numerous kidney infections.  SYMPTOMS    Feeling sick to your stomach (nauseous).   Throwing up  (vomiting).   Blood in the urine (hematuria).   Pain that usually spreads (radiates) to the groin.   Frequency or urgency of urination.  DIAGNOSIS    Taking a history and physical exam.   Blood or urine tests.   CT scan.   Occasionally, an examination of the inside of the urinary bladder (cystoscopy) is performed.  TREATMENT    Observation.   Increasing your fluid intake.   Extracorporeal shock wave lithotripsy--This is a noninvasive procedure that uses shock waves to break up kidney stones.   Surgery may be needed if you have severe pain or persistent obstruction. There are various surgical procedures. Most of the procedures are performed with the use of small instruments. Only small incisions are needed to accommodate these instruments, so recovery time is minimized.  The size, location, and chemical composition are all important variables that will determine the proper choice of action for you. Talk to your health care provider to better understand your situation so that you will minimize the risk of injury to yourself and your kidney.   HOME CARE INSTRUCTIONS    Drink enough water and fluids to keep your urine clear or pale yellow. This will help you to pass the stone or stone fragments.   Strain all urine through the provided strainer. Keep all particulate matter and stones for your health care provider to see. The stone causing the pain may be as small as a grain of salt. It is very important to use the strainer each and every time you pass your urine. The collection of your stone will allow your health care provider to analyze it and verify that a stone has actually passed. The stone analysis will often identify what you can do to reduce the incidence of recurrences.   Only take over-the-counter or prescription medicines for pain, discomfort, or fever as directed by your health care provider.   Make a follow-up appointment with your health care provider as directed.   Get follow-up X-rays if  required. The absence of pain does not always mean that the stone has passed. It may have only stopped moving. If the urine remains completely obstructed, it can cause loss of kidney function or even complete destruction of the kidney. It is your responsibility to make sure X-rays and follow-ups are completed. Ultrasounds of the kidney can show blockages and the status of the kidney. Ultrasounds are not associated with any radiation and can be performed easily in a matter of minutes.  SEEK MEDICAL CARE IF:   You experience pain that is progressive and unresponsive to any pain medicine you have been prescribed.  SEEK IMMEDIATE MEDICAL CARE IF:    Pain cannot be controlled with the prescribed medicine.   You have a fever or shaking chills.   The severity or intensity of pain increases over 18 hours and is not relieved by pain medicine.   You develop a   new onset of abdominal pain.   You feel faint or pass out.   You are unable to urinate.  MAKE SURE YOU:    Understand these instructions.   Will watch your condition.   Will get help right away if you are not doing well or get worse.  Document Released: 03/06/2005 Document Revised: 11/06/2012 Document Reviewed: 08/07/2012  [employer] Patient Information 2015 [employer]. This information is not intended to replace advice given to you by your health care provider. Make sure you discuss any questions you have with your health care provider.

## 2014-09-25 NOTE — Progress Notes (Signed)
MRN: [id number] DOB: 21-Dec-1955  Subjective:   Kimberly Duke is a 59 y.o. female presenting for chief complaint of Urinary Tract Infection; Back Pain; and Chills  Reports 3 day history of cloudy malodorous urine, low back pain, abdominal pain, started having chills last night. Has tried Tylenol with some relief. Admits history of UTIs, renal stones requiring lithotripsy. Denies fever, dysuria, hematuria, urinary frequency, urinary urgency, flank pain and pelvic pain, nausea, vomiting, diarrhea, genital rashes or genital irritation. Denies any other aggravating or relieving factors, no other questions or concerns.  Kimberly Duke has a current medication list which includes the following prescription(s): benzonatate, fenofibrate, glimepiride, hydrocodone-homatropine, ibuprofen, insulin glargine, loratadine, metformin, multivitamin, and psyllium. She is allergic to amoxicillin and darvon.  Kimberly Duke  has a past medical history of Hyperlipidemia; Osteoporosis; Type 2 diabetes mellitus; Renal calculus, left; GERD (gastroesophageal reflux disease); History of hypothyroidism; Thyroid nodule; History of kidney stones; History of endometriosis; Wears glasses; Upper airway cough syndrome; Arthritis; and PONV (postoperative nausea and vomiting). Also  has past surgical history that includes Tubal ligation (yrs ago); Total abdominal hysterectomy w/ bilateral salpingoophorectomy (1997); Extracorporeal shock wave lithotripsy (Left, x2  last one 1990's); Ureteral reimplantion (Right, 1993 (approx)); Cystoscopy with ureteroscopy and stent placement (Left, 11/19/2013); and Holmium laser application (Left, 11/19/2013).  ROS As in subjective.  Objective:   Vitals: BP 118/68 mmHg  Pulse 106  Temp(Src) 99.2 F (37.3 C)  Resp 20  Ht 5' 6.5" (1.689 m)  Wt 172 lb (78.019 kg)  BMI 27.35 kg/m2  SpO2 98%  Physical Exam  Constitutional: She appears well-developed and well-nourished.  Cardiovascular: Normal rate, regular  rhythm and intact distal pulses.  Exam reveals no gallop and no friction rub.   No murmur heard. Pulmonary/Chest: No respiratory distress. She has no wheezes. She has no rales.  Abdominal: Soft. Bowel sounds are normal. She exhibits no distension and no mass. There is tenderness (generalized throughout).  Right-sided CVA tenderness.  Musculoskeletal: She exhibits no edema.  Skin: Skin is warm and dry. No rash noted. No erythema. No pallor.   Results for orders placed or performed in visit on 09/25/14 (from the past 24 hour(s))  POCT UA - Microscopic Only     Status: None   Collection Time: 09/25/14  9:25 AM  Result Value Ref Range   WBC, Ur, HPF, POC 1-2    RBC, urine, microscopic 0-1    Bacteria, U Microscopic few    Mucus, UA positive    Epithelial cells, urine per micros 5-10    Crystals, Ur, HPF, POC neg    Casts, Ur, LPF, POC neg    Yeast, UA neg   POCT urinalysis dipstick     Status: None   Collection Time: 09/25/14  9:25 AM  Result Value Ref Range   Color, UA yellow    Clarity, UA clear    Glucose, UA neg    Bilirubin, UA small    Ketones, UA neg    Spec Grav, UA 1.025    Blood, UA neg    pH, UA 6.0    Protein, UA 30    Urobilinogen, UA 1.0    Nitrite, UA neg    Leukocytes, UA Negative Negative   Assessment and Plan :   1. Flank pain 2. History of renal stone 3. History of recurrent UTIs 4. Cystitis - Will cover for UTI with Macrobid, advised aggressive hydration, consider infected renal stone given history. Reviewed worsening symptoms of infection, patient is to  return to clinic if these develop. Patient did have a difficult time providing a urine sample, urine culture pending. She declined further testing today including abd U/S, cmet. States that she has an appointment with her PCP on 09/28/2014 and is supposed to see her endocrinologist soon as well.  Wallis BambergMario Brecken Walth, PA-C [facility] [phone] 09/25/2014 8:38 AM

## 2014-09-26 ENCOUNTER — Ambulatory Visit (INDEPENDENT_AMBULATORY_CARE_PROVIDER_SITE_OTHER): Payer: 59 | Admitting: Family Medicine

## 2014-09-26 ENCOUNTER — Telehealth: Payer: Self-pay

## 2014-09-26 ENCOUNTER — Ambulatory Visit (HOSPITAL_COMMUNITY)
Admission: RE | Admit: 2014-09-26 | Discharge: 2014-09-26 | Disposition: A | Discharge status: Home / Self Care | Payer: 59 | Source: Ambulatory Visit | Attending: Family Medicine | Admitting: Family Medicine

## 2014-09-26 ENCOUNTER — Ambulatory Visit (HOSPITAL_COMMUNITY)
Admission: RE | Admit: 2014-09-26 | Discharge: 2014-09-26 | Disposition: A | Discharge status: Home / Self Care | Payer: 59 | Source: Other Acute Inpatient Hospital | Attending: Urgent Care | Admitting: Urgent Care

## 2014-09-26 VITALS — BP 120/60 | HR 100 | Temp 98.5°F | Resp 18 | Ht 66.5 in | Wt 174.0 lb

## 2014-09-26 DIAGNOSIS — R8299 Other abnormal findings in urine: Secondary | ICD-10-CM

## 2014-09-26 DIAGNOSIS — R1084 Generalized abdominal pain: Secondary | ICD-10-CM

## 2014-09-26 DIAGNOSIS — R82998 Other abnormal findings in urine: Secondary | ICD-10-CM

## 2014-09-26 DIAGNOSIS — R1011 Right upper quadrant pain: Secondary | ICD-10-CM | POA: Insufficient documentation

## 2014-09-26 DIAGNOSIS — N3090 Cystitis, unspecified without hematuria: Secondary | ICD-10-CM

## 2014-09-26 DIAGNOSIS — K760 Fatty (change of) liver, not elsewhere classified: Secondary | ICD-10-CM | POA: Insufficient documentation

## 2014-09-26 DIAGNOSIS — Z87442 Personal history of urinary calculi: Secondary | ICD-10-CM | POA: Insufficient documentation

## 2014-09-26 DIAGNOSIS — R112 Nausea with vomiting, unspecified: Secondary | ICD-10-CM | POA: Insufficient documentation

## 2014-09-26 LAB — CBC
HCT: 35.6 % — ABNORMAL LOW (ref 36.0–46.0)
Hemoglobin: 11.9 g/dL — ABNORMAL LOW (ref 12.0–15.0)
MCH: 28.3 pg (ref 26.0–34.0)
MCHC: 33.4 g/dL (ref 30.0–36.0)
MCV: 84.8 fL (ref 78.0–100.0)
MPV: 11.5 fL (ref 8.6–12.4)
Platelets: 176 10*3/uL (ref 150–400)
RBC: 4.2 MIL/uL (ref 3.87–5.11)
RDW: 13.5 % (ref 11.5–15.5)
WBC: 10 10*3/uL (ref 4.0–10.5)

## 2014-09-26 LAB — COMPREHENSIVE METABOLIC PANEL
ALT: 20 U/L (ref 0–35)
AST: 26 U/L (ref 0–37)
Albumin: 4.3 g/dL (ref 3.5–5.2)
Alkaline Phosphatase: 65 U/L (ref 39–117)
BUN: 18 mg/dL (ref 6–23)
CO2: 21 mEq/L (ref 19–32)
Calcium: 9.1 mg/dL (ref 8.4–10.5)
Chloride: 100 mEq/L (ref 96–112)
Creat: 1.15 mg/dL — ABNORMAL HIGH (ref 0.50–1.10)
Glucose, Bld: 192 mg/dL — ABNORMAL HIGH (ref 70–99)
Potassium: 4.7 mEq/L (ref 3.5–5.3)
Sodium: 136 mEq/L (ref 135–145)
Total Bilirubin: 0.9 mg/dL (ref 0.2–1.2)
Total Protein: 7.1 g/dL (ref 6.0–8.3)

## 2014-09-26 LAB — LIPASE: Lipase: <10 U/L (ref 0–75)

## 2014-09-26 LAB — POCT UA - MICROSCOPIC ONLY
Casts, Ur, LPF, POC: NEGATIVE
Crystals, Ur, HPF, POC: NEGATIVE
Yeast, UA: NEGATIVE

## 2014-09-26 LAB — POCT URINALYSIS DIPSTICK
Blood, UA: NEGATIVE
Glucose, UA: NEGATIVE
Ketones, UA: NEGATIVE
Leukocytes, UA: NEGATIVE
Nitrite, UA: NEGATIVE
Protein, UA: 100
Spec Grav, UA: >=1.03
Urobilinogen, UA: 2
pH, UA: 5.5

## 2014-09-26 LAB — URINE CULTURE
Colony Count: NO GROWTH
Organism ID, Bacteria: NO GROWTH

## 2014-09-26 MED ORDER — CIPROFLOXACIN HCL 500 MG PO TABS: 500.0000 mg | ORAL_TABLET | Freq: Two times a day (BID) | ORAL | Status: DC

## 2014-09-26 MED ORDER — ONDANSETRON 8 MG PO TBDP: 8.0000 mg | ORAL_TABLET | Freq: Three times a day (TID) | ORAL | Status: DC | PRN

## 2014-09-26 MED ORDER — ONDANSETRON HCL 4 MG/2ML IJ SOLN
2.0000 mg | Freq: Once | INTRAMUSCULAR | Status: AC
  Administered 2014-09-26: 2 mg via INTRAMUSCULAR

## 2014-09-26 NOTE — Progress Notes (Signed)
MRN: [id number] DOB: 08/09/1955  Subjective:   Kimberly Duke is a 59 y.o. female presenting for follow up on cystitis. Reports that she started having nausea and vomiting this morning, fever as high as 103. Has tried ibuprofen with some relief of her fever. Her generalized abdominal pain has improved slightly per patient but has not resolved despite 3 doses of antibiotic. Denies flank pain, back pain, hematuria, anuria, oliguria, diarrhea, constipation. Denies chest pain, shob. She has been trying to hydrate well but since this morning has not been able to hold anything down. Last night, patient reports eating a [employer] burger but has not eaten since then. She denies history of liver disease, gallbladder disease. Denies any other aggravating or relieving factors, no other questions or concerns.  Kimberly Duke has a current medication list which includes the following prescription(s): benzonatate, fenofibrate, glimepiride, ibuprofen, insulin glargine, loratadine, metformin, multivitamin, nitrofurantoin (macrocrystal-monohydrate), psyllium, and hydrocodone-homatropine. Kimberly Duke is allergic to amoxicillin and darvon.  Kimberly Duke  has a past medical history of Hyperlipidemia; Osteoporosis; Type 2 diabetes mellitus; Renal calculus, left; GERD (gastroesophageal reflux disease); History of hypothyroidism; Thyroid nodule; History of kidney stones; History of endometriosis; Wears glasses; Upper airway cough syndrome; Arthritis; and PONV (postoperative nausea and vomiting). Also  has past surgical history that includes Tubal ligation (yrs ago); Total abdominal hysterectomy w/ bilateral salpingoophorectomy (1997); Extracorporeal shock wave lithotripsy (Left, x2  last one 1990's); Ureteral reimplantion (Right, 1993 (approx)); Cystoscopy with ureteroscopy and stent placement (Left, 11/19/2013); and Holmium laser application (Left, 11/19/2013).  ROS As in subjective.  Objective:   Vitals: BP 120/60 mmHg  Pulse 100   Temp(Src) 98.5 F (36.9 C) (Oral)  Resp 18  Ht 5' 6.5" (1.689 m)  Wt 174 lb (78.926 kg)  BMI 27.67 kg/m2  SpO2 98%  Physical Exam  Constitutional: She is oriented to person, place, and time. She appears well-developed and well-nourished.  Cardiovascular: Normal rate, regular rhythm and intact distal pulses.  Exam reveals no gallop and no friction rub.   No murmur heard. Pulmonary/Chest: No respiratory distress. She has no wheezes. She has no rales.  Abdominal: She exhibits distension. She exhibits no mass. There is tenderness (Generalized, worse in RUQ).  Neurological: She is alert and oriented to person, place, and time.  Skin: Skin is warm and dry. No rash noted. No erythema. No pallor.  Psychiatric: She has a normal mood and affect.   Results for orders placed or performed in visit on 09/26/14 (from the past 24 hour(s))  POCT urinalysis dipstick     Status: None   Collection Time: 09/26/14 12:19 PM  Result Value Ref Range   Color, UA dk yellow    Clarity, UA clear    Glucose, UA negative    Bilirubin, UA small    Ketones, UA negative    Spec Grav, UA >=1.030    Blood, UA negative    pH, UA 5.5    Protein, UA 100    Urobilinogen, UA 2.0    Nitrite, UA negative    Leukocytes, UA Negative Negative  POCT UA - Microscopic Only     Status: None   Collection Time: 09/26/14 12:19 PM  Result Value Ref Range   WBC, Ur, HPF, POC 1-5    RBC, urine, microscopic 0-2    Bacteria, U Microscopic 1+    Mucus, UA moderate    Epithelial cells, urine per micros 5-10    Crystals, Ur, HPF, POC negative    Casts, Ur, LPF, POC  negative    Yeast, UA negative    Assessment and Plan :   1. RUQ pain 2. Generalized abdominal pain 3. Nausea with vomiting - Worrisome for cholecystitis versus pancreatitis, labs drawn today. IV fluids given today for dehydration secondary to n/v. - Will send patient for U/S today  3. Dark urine 4. Cystitis - Will cover for possible pyelonephritis with Cipro,  stop Macrobid, due to allergies to Amoxicillin will hold off on Rocefin injection - Urine culture pending  Kimberly Duke, Kimberly Duke [facility] [phone] 09/26/2014 11:47 AM   addnd [name] and agree with his assessment and plan.  She exhibits RUQ tenderness.  Felt better after IV hydration.  Her US did not show gallstones and her labs are most consistent with a viral gastroenteritis.  Her labs are benign and do not provide any clues about her illness.  Called around 7;45 and spoke with her husband.  Kimberly Duke is asleep- they will call me if any trouble overnight and otherwise I will call in the am  Meds ordered this encounter  Medications  . ciprofloxacin (CIPRO) 500 MG tablet    Sig: Take 1 tablet (500 mg total) by mouth 2 (two) times daily.    Dispense:  20 tablet    Refill:  0    Order Specific Question:  Supervising Provider    Answer:  SHAW, EVA N [[id number]]  . ondansetron (ZOFRAN) injection 2 mg    Sig:   . ondansetron (ZOFRAN-ODT) 8 MG disintegrating tablet    Sig: Take 1 tablet (8 mg total) by mouth every 8 (eight) hours as needed for nausea.    Dispense:  30 tablet    Refill:  0    Order Specific Question:  Supervising Provider    Answer:  SHAW, EVA N [[id number]]     ULTRASOUND ABDOMEN COMPLETE  COMPARISON: CT on 10/27/2013  FINDINGS: Gallbladder: No gallstones or wall thickening visualized. No sonographic Murphy sign noted. Common bile duct: Diameter: 3 mm, within normal limits Liver: Diffusely increased echogenicity of the hepatic parenchyma, consistent with diffuse hepatic steatosis/hepatocellular disease. No focal mass lesion identified. IVC: No abnormality visualized. Pancreas: Visualized portion unremarkable. Spleen: Spleen measures 14 cm in length, with calculated volume of 690 mL. No splenic lesions seen. Right Kidney: Length: 12.7 cm. Echogenicity within normal limits. No mass  or hydronephrosis visualized. Left Kidney: Length: 13.2 cm. Echogenicity within normal limits. No mass or hydronephrosis visualized. Abdominal aorta: No aneurysm visualized. Other findings: None.  IMPRESSION: No evidence of cholelithiasis, biliary dilatation, or other acute findings.  Diffuse hepatic steatosis/hepatocellular disease.   Results for orders placed or performed in visit on 09/26/14  Comprehensive metabolic panel  Result Value Ref Range   Sodium 136 135 - 145 mEq/L   Potassium 4.7 3.5 - 5.3 mEq/L   Chloride 100 96 - 112 mEq/L   CO2 21 19 - 32 mEq/L   Glucose, Bld 192 (H) 70 - 99 mg/dL   BUN 18 6 - 23 mg/dL   Creat 1.471.15 (H) 8.290.50 - 1.10 mg/dL   Total Bilirubin 0.9 0.2 - 1.2 mg/dL   Alkaline Phosphatase 65 39 - 117 U/L   AST 26 0 - 37 U/L   ALT 20 0 - 35 U/L   Total Protein 7.1 6.0 - 8.3 g/dL   Albumin 4.3 3.5 - 5.2 g/dL   Calcium 9.1 8.4 - 56.210.5 mg/dL  CBC  Result  Value Ref Range   WBC 10.0 4.0 - 10.5 K/uL   RBC 4.20 3.87 - 5.11 MIL/uL   Hemoglobin 11.9 (L) 12.0 - 15.0 g/dL   HCT 40.9 (L) 81.1 - 91.4 %   MCV 84.8 78.0 - 100.0 fL   MCH 28.3 26.0 - 34.0 pg   MCHC 33.4 30.0 - 36.0 g/dL   RDW 78.2 95.6 - 21.3 %   Platelets 176 150 - 400 K/uL   MPV 11.5 8.6 - 12.4 fL  Lipase  Result Value Ref Range   Lipase <10 0 - 75 U/L  POCT urinalysis dipstick  Result Value Ref Range   Color, UA dk yellow    Clarity, UA clear    Glucose, UA negative    Bilirubin, UA small    Ketones, UA negative    Spec Grav, UA >=1.030    Blood, UA negative    pH, UA 5.5    Protein, UA 100    Urobilinogen, UA 2.0    Nitrite, UA negative    Leukocytes, UA Negative Negative  POCT UA - Microscopic Only  Result Value Ref Range   WBC, Ur, HPF, POC 1-5    RBC, urine, microscopic 0-2    Bacteria, U Microscopic 1+    Mucus, UA moderate    Epithelial cells, urine per micros 5-10    Crystals, Ur, HPF, POC negative    Casts, Ur, LPF, POC negative    Yeast, UA negative

## 2014-09-26 NOTE — Telephone Encounter (Signed)
Pt saw mike mani yesterday for a uti and was told if symptoms change to call -she has experienced nausea with vomitting and has a fever of 102.2

## 2014-09-26 NOTE — Patient Instructions (Addendum)
-   [facility] [address], [place]. Go through the Emergency Room Department Entrance. Ask for the  Radiology Department.   Go to the Radiology Department and Someone will get you from there. They are very busy so please be patient for them to work you in.

## 2014-09-26 NOTE — Telephone Encounter (Signed)
I prescribed patient antibiotic yesterday. Please have her start this. If she starts to have worsening abdominal pain, she needs to come back. Unfortunately, she should have started antibiotic yesterday, I'm not sure why she didn't. Thank you!

## 2014-09-29 NOTE — Telephone Encounter (Signed)
Patient states that she is feeling much better however she is still having night sweats.   [phone]

## 2014-09-30 NOTE — Telephone Encounter (Signed)
Please have her take her temperature to make sure she is not having a fever. If she is, then I recommend she take Tylenol or ibuprofen. I'm glad she is feeling better but if she continues to have fevers or night sweats, she needs to rtc for re-evaluation after antibiotic course. Thank you!

## 2014-10-01 NOTE — Telephone Encounter (Signed)
Left detailed message on machine.

## 2014-10-06 ENCOUNTER — Encounter: Payer: Self-pay | Admitting: *Deleted

## 2014-10-09 ENCOUNTER — Other Ambulatory Visit: Payer: Self-pay | Admitting: Family Medicine

## 2014-10-09 ENCOUNTER — Ambulatory Visit
Admission: RE | Admit: 2014-10-09 | Discharge: 2014-10-09 | Disposition: A | Discharge status: Home / Self Care | Payer: 59 | Source: Ambulatory Visit | Attending: Family Medicine | Admitting: Family Medicine

## 2014-10-09 DIAGNOSIS — R05 Cough: Secondary | ICD-10-CM

## 2014-10-09 DIAGNOSIS — R053 Chronic cough: Secondary | ICD-10-CM

## 2015-07-10 ENCOUNTER — Ambulatory Visit (INDEPENDENT_AMBULATORY_CARE_PROVIDER_SITE_OTHER): Payer: Managed Care, Other (non HMO) | Admitting: Physician Assistant

## 2015-07-10 VITALS — BP 142/92 | HR 100 | Temp 98.0°F | Resp 17 | Ht 65.5 in | Wt 173.0 lb

## 2015-07-10 DIAGNOSIS — J309 Allergic rhinitis, unspecified: Secondary | ICD-10-CM

## 2015-07-10 DIAGNOSIS — R059 Cough, unspecified: Secondary | ICD-10-CM

## 2015-07-10 DIAGNOSIS — R05 Cough: Secondary | ICD-10-CM

## 2015-07-10 MED ORDER — BENZONATATE 100 MG PO CAPS: 100.0000 mg | ORAL_CAPSULE | Freq: Three times a day (TID) | ORAL | Status: DC | PRN

## 2015-07-10 MED ORDER — IPRATROPIUM BROMIDE 0.03 % NA SOLN: 2.0000 | Freq: Two times a day (BID) | NASAL | Status: DC

## 2015-07-10 NOTE — Progress Notes (Signed)
Patient ID: Kimberly Duke, female    DOB: [birth year], 60 y.o.   MRN: [id number]  PCP: Redmond Baseman, MD  Subjective:   Chief Complaint  Patient presents with  . Cough    productive cough, runny nose, chest congestion     HPI Presents for evaluation of cough and congestion.  Patient states that her clear rhinorrhea and congestion started 4 days ago. And she since has developed a non productive cough that started 3 days ago. Patient states she has had subjective fevers at home although she has not taken her temperature. She complains of sore throat, chest congestion, and some ear fullness.  She denies any sinus pressure, cp, sob, wheezing, N/V/D, and abd pain. She also has sick contacts in the house.  Patient has not tried anything for her symptoms. She does have a history of seasonal allergies for which she takes OTC Claritin. She report sick contacts in the house.   Review of Systems Constitutional: Negative for fever, chills and fatigue.  HENT: Positive for congestion, postnasal drip, rhinorrhea, sneezing and sore throat. Negative for sinus pressure.  Eyes: Negative.  Respiratory: Positive for cough. Negative for shortness of breath and wheezing.  Cardiovascular: Negative for chest pain and palpitations.  Gastrointestinal: Negative for nausea, vomiting, abdominal pain and diarrhea.  Musculoskeletal: Negative for myalgias.  Neurological: Negative for dizziness, weakness, light-headedness and headaches.     Patient Active Problem List   Diagnosis Date Noted  . Upper airway cough syndrome 04/10/2013  . Diabetes mellitus 06/19/2011     Prior to Admission medications   Medication Sig Start Date End Date Taking? Authorizing Provider  fenofibrate 160 MG tablet Take 160 mg by mouth daily.   Yes Historical Provider, MD  glimepiride (AMARYL) 4 MG tablet Take 4 mg by mouth 2 (two) times daily.   Yes Historical Provider, MD  ibuprofen (ADVIL,MOTRIN) 200 MG tablet Take  200 mg by mouth every 6 (six) hours as needed.   Yes Historical Provider, MD  loratadine (CLARITIN) 10 MG tablet Take 10 mg by mouth daily.   Yes Historical Provider, MD  metFORMIN (GLUCOPHAGE) 500 MG tablet Take 1,000 mg by mouth 2 (two) times daily.    Yes Historical Provider, MD  Multiple Vitamin (MULTIVITAMIN) tablet Take 1 tablet by mouth daily.   Yes Historical Provider, MD  psyllium (METAMUCIL) 58.6 % powder Take 1 packet by mouth daily.   Yes Historical Provider, MD  LANTUS SOLOSTAR 100 UNIT/ML Solostar Pen ADM 20 TO 28 UNITS [place] QD HS 05/13/15   Historical Provider, MD     Allergies  Allergen Reactions  . Amoxicillin Hives  . Darvon Nausea And Vomiting       Objective:  Physical Exam  Constitutional: She is oriented to person, place, and time. She appears well-developed and well-nourished. She is active and cooperative. No distress.  BP 142/92 mmHg  Pulse 100  Temp(Src) 98 F (36.7 C) (Oral)  Resp 17  Ht 5' 5.5" (1.664 m)  Wt 173 lb (78.472 kg)  BMI 28.34 kg/m2  SpO2 98%  HENT:  Head: Normocephalic and atraumatic.  Right Ear: Hearing, tympanic membrane, external ear and ear canal normal.  Left Ear: Hearing, tympanic membrane, external ear and ear canal normal.  Nose: Mucosal edema (pale turbinates) and rhinorrhea (clear) present.  Mouth/Throat: Uvula is midline, oropharynx is clear and moist and mucous membranes are normal. No oral lesions. No uvula swelling.  Eyes: Conjunctivae are normal. No scleral icterus.  Neck: Normal range of  motion, full passive range of motion without pain and phonation normal. Neck supple. No thyromegaly present.  Cardiovascular: Normal rate, regular rhythm and normal heart sounds.   Pulses:      Radial pulses are 2+ on the right side, and 2+ on the left side.  Pulmonary/Chest: Effort normal and breath sounds normal.  Lymphadenopathy:       Head (right side): No tonsillar, no preauricular, no posterior auricular and no occipital adenopathy  present.       Head (left side): No tonsillar, no preauricular, no posterior auricular and no occipital adenopathy present.    She has no cervical adenopathy.       Right: No supraclavicular adenopathy present.       Left: No supraclavicular adenopathy present.  Neurological: She is alert and oriented to person, place, and time. No sensory deficit.  Skin: Skin is warm, dry and intact. No rash noted. No cyanosis or erythema. Nails show no clubbing.  Psychiatric: She has a normal mood and affect. Her speech is normal and behavior is normal.           Assessment & Plan:   1. Cough 2. Allergic rhinitis, unspecified allergic rhinitis type Supportive care.  Anticipatory guidance.  RTC if symptoms worsen/persist. - benzonatate (TESSALON) 100 MG capsule; Take 1-2 capsules (100-200 mg total) by mouth 3 (three) times daily as needed for cough.  Dispense: 40 capsule; Refill: 0 - ipratropium (ATROVENT) 0.03 % nasal spray; Place 2 sprays into both nostrils 2 (two) times daily.  Dispense: 30 mL; Refill: 0   Fernande Brashelle S. Olita Takeshita, PA-C Physician Assistant-Certified [facility]

## 2015-07-10 NOTE — Progress Notes (Signed)
Subjective:    Patient ID: Kimberly Duke, female    DOB: 17-May-1955, 60 y.o.   MRN: [id number]  Chief Complaint  Patient presents with  . Cough    productive cough, runny nose, chest congestion     HPI  Patient presents with a history of HTN, hyperlipidemia, DM and GERD for cough and congestion.  Patient states that her clear rhinorrhea and congestion started 4 days ago. And she since has developed a non productive cough that started 3 days ago. Patient states she has had subjective fevers at home although she has not taken her temperature. She complains of sore throat, chest congestion, and some ear fullness.  She denies any sinus pressure, cp, sob, wheezing, N/V/D, and abd pain. She also has sick contacts in the house.  Patient has not tried anything for her symptoms. She does have a history of seasonal allergies for which she takes OTC Claritin. She report sick contacts in the house.  Past Medical History  Diagnosis Date  . Hyperlipidemia   . Osteoporosis   . Type 2 diabetes mellitus (HCC)   . Renal calculus, left   . GERD (gastroesophageal reflux disease)     without acid reflux  . History of hypothyroidism   . Thyroid nodule     monitored by dr balan-  negartive bx's  . History of kidney stones   . History of endometriosis   . Wears glasses   . Upper airway cough syndrome     pulmologist-   dr wert  . Arthritis   . PONV (postoperative nausea and vomiting)    Current Outpatient Prescriptions on File Prior to Visit  Medication Sig Dispense Refill  . fenofibrate 160 MG tablet Take 160 mg by mouth daily.    Marland Kitchen glimepiride (AMARYL) 4 MG tablet Take 4 mg by mouth 2 (two) times daily.    Marland Kitchen ibuprofen (ADVIL,MOTRIN) 200 MG tablet Take 200 mg by mouth every 6 (six) hours as needed.    . loratadine (CLARITIN) 10 MG tablet Take 10 mg by mouth daily.    . metFORMIN (GLUCOPHAGE) 500 MG tablet Take 1,000 mg by mouth 2 (two) times daily.     . Multiple Vitamin (MULTIVITAMIN)  tablet Take 1 tablet by mouth daily.    . psyllium (METAMUCIL) 58.6 % powder Take 1 packet by mouth daily.     No current facility-administered medications on file prior to visit.   Allergies  Allergen Reactions  . Amoxicillin Hives  . Darvon Nausea And Vomiting       Review of Systems  Constitutional: Negative for fever, chills and fatigue.  HENT: Positive for congestion, postnasal drip, rhinorrhea, sneezing and sore throat. Negative for sinus pressure.   Eyes: Negative.   Respiratory: Positive for cough. Negative for shortness of breath and wheezing.   Cardiovascular: Negative for chest pain and palpitations.  Gastrointestinal: Negative for nausea, vomiting, abdominal pain and diarrhea.  Musculoskeletal: Negative for myalgias.  Neurological: Negative for dizziness, weakness, light-headedness and headaches.       Objective:   Physical Exam  Constitutional: She is oriented to person, place, and time. She appears well-developed and well-nourished. No distress.  HENT:  Head: Normocephalic and atraumatic.  Right Ear: External ear normal.  Left Ear: External ear normal.  Nose: Nose normal.  Mouth/Throat: Oropharynx is clear and moist. No oropharyngeal exudate.  Eyes: Conjunctivae are normal. Pupils are equal, round, and reactive to light.  Neck: Normal range of motion. Neck supple. No thyromegaly  present.  Cardiovascular: Normal rate, regular rhythm, normal heart sounds and intact distal pulses.   Pulmonary/Chest: Effort normal and breath sounds normal. She has no wheezes. She exhibits no tenderness.  Lymphadenopathy:    She has no cervical adenopathy.  Neurological: She is alert and oriented to person, place, and time.  Skin: Skin is warm and dry.  Psychiatric: She has a normal mood and affect. Her behavior is normal. Judgment and thought content normal.          Assessment & Plan:  1. Cough 2. Allergic rhinitis, unspecified allergic rhinitis type Likely allergy  exacerbation. Patient has both upper and lower respiratory symptoms. Will treat symptomatically and patient encouraged to get plenty or rest and drink fluids. - benzonatate (TESSALON) 100 MG capsule; Take 1-2 capsules (100-200 mg total) by mouth 3 (three) times daily as needed for cough.  Dispense: 40 capsule; Refill: 0 - ipratropium (ATROVENT) 0.03 % nasal spray; Place 2 sprays into both nostrils 2 (two) times daily.  Dispense: 30 mL; Refill: 0  Azucena Kubayler Ordean Fouts PA-S 07/10/2015

## 2015-07-10 NOTE — Patient Instructions (Addendum)
Get plenty of rest and drink at least 64 ounces of water a day to stay hydrated. Use medication as prescribed to help with cough and congestion.   Please schedule a follow-up visit with Dr. Modesto CharonWong and your diabetes specialist, as there are a number of outstanding items to update.    IF you received an x-ray today, you will receive an invoice from [facility]. Please contact [facility] at [phone] with questions or concerns regarding your invoice.   IF you received labwork today, you will receive an invoice from [facility]. Please contact [facility] at [phone] with questions or concerns regarding your invoice.   Our billing staff will not be able to assist you with questions regarding bills from these companies.  You will be contacted with the lab results as soon as they are available. The fastest way to get your results is to activate your My Chart account. Instructions are located on the last page of this paperwork. If you have not heard from us regarding the results in 2 weeks, please contact this office.

## 2015-08-10 ENCOUNTER — Ambulatory Visit (INDEPENDENT_AMBULATORY_CARE_PROVIDER_SITE_OTHER): Payer: Managed Care, Other (non HMO) | Admitting: Family Medicine

## 2015-08-10 VITALS — BP 132/80 | HR 91 | Temp 98.3°F | Resp 18 | Ht 65.5 in | Wt 175.0 lb

## 2015-08-10 DIAGNOSIS — S20212A Contusion of left front wall of thorax, initial encounter: Secondary | ICD-10-CM

## 2015-08-10 DIAGNOSIS — R05 Cough: Secondary | ICD-10-CM

## 2015-08-10 DIAGNOSIS — R059 Cough, unspecified: Secondary | ICD-10-CM

## 2015-08-10 MED ORDER — IBUPROFEN 800 MG PO TABS: 800.0000 mg | ORAL_TABLET | Freq: Three times a day (TID) | ORAL | Status: DC | PRN

## 2015-08-10 MED ORDER — BENZONATATE 100 MG PO CAPS: 100.0000 mg | ORAL_CAPSULE | Freq: Three times a day (TID) | ORAL | Status: DC | PRN

## 2015-08-10 MED ORDER — CYCLOBENZAPRINE HCL 10 MG PO TABS: ORAL_TABLET | ORAL | Status: DC

## 2015-08-10 NOTE — Progress Notes (Signed)
Subjective:    Patient ID: Kimberly Duke, female    DOB: Jan 04, 1956, 60 y.o.   MRN: [id number]  HPI This is a 60 yo female who presents today following a fall 4 days ago. She was dusting an outdoor canopy and fell on an outdoor grill. Fell onto left buttock and side onto rounded part then fell to ground. Some pain on left hip. Difficulty getting in and out of bed, worse after lying still. Pain worse on left rib cage area.  Pain with coughing. Requests Tessalon Perles. No SOB. No neck or back pain.   Past Medical History  Diagnosis Date  . Hyperlipidemia   . Osteoporosis   . Type 2 diabetes mellitus (HCC)   . Renal calculus, left   . GERD (gastroesophageal reflux disease)     without acid reflux  . History of hypothyroidism   . Thyroid nodule     monitored by dr balan-  negartive bx's  . History of kidney stones   . History of endometriosis   . Wears glasses   . Upper airway cough syndrome     pulmologist-   dr wert  . Arthritis   . PONV (postoperative nausea and vomiting)    Past Surgical History  Procedure Laterality Date  . Tubal ligation  yrs ago  . Total abdominal hysterectomy w/ bilateral salpingoophorectomy  1997  . Extracorporeal shock wave lithotripsy Left x2  last one 1990's  . Ureteral reimplantion Right 1993 (approx)    w/  endometriosis resectED from ureter  . Cystoscopy with ureteroscopy and stent placement Left 11/19/2013    Procedure: CYSTOSCOPY WITH URETEROSCOPY , RETROGRADE AND STENT PLACEMENT, STONE EXTRACTION;  Surgeon: Sebastian Ache, MD;  Location: [facility];  Service: Urology;  Laterality: Left;  . Holmium laser application Left 11/19/2013    Procedure: HOLMIUM LASER APPLICATION;  Surgeon: Sebastian Ache, MD;  Location: [facility];  Service: Urology;  Laterality: Left;   Family History  Problem Relation Age of Onset  . Diabetes Mother   . Hyperlipidemia Mother   . COPD Father     smoked  . Diabetes Sister   . Breast  cancer Paternal Grandmother   . Cancer Paternal Grandmother     breast cancer (older onset)  . Lung cancer Paternal Grandfather     smoked  . Cancer Paternal Grandfather     lung (smoker)  . Diabetes Brother   . Hypertension Brother   . Cancer Maternal Grandmother 56    breast cancer  . Cancer Maternal Grandfather     lung cancer   Social History  Substance Use Topics  . Smoking status: Never Smoker   . Smokeless tobacco: Never Used  . Alcohol Use: No      Review of Systems Per HPI    Objective:   Physical Exam Physical Exam  Vitals reviewed. Constitutional: Oriented to person, place, and time. Appears well-developed and well-nourished.  HENT:  Head: Normocephalic and atraumatic.  Eyes: Conjunctivae are normal.  Neck: Normal range of motion. Neck supple.  Cardiovascular: Normal rate.   Pulmonary/Chest: Effort normal.  Musculoskeletal: Normal range of motion. Resolving ecchymosis and tenderness left lateral ribcage.  Neurological: Alert and oriented to person, place, and time.  Skin: Skin is warm and dry.  Psychiatric: Normal mood and affect. Behavior is normal. Judgment and thought content normal.      BP 132/80 mmHg  Pulse 91  Temp(Src) 98.3 F (36.8 C) (Oral)  Resp 18  Ht 5' 5.5" (1.664 m)  Wt 175 lb (79.379 kg)  BMI 28.67 kg/m2  SpO2 95% Wt Readings from Last 3 Encounters:  08/10/15 175 lb (79.379 kg)  07/10/15 173 lb (78.472 kg)  09/26/14 174 lb (78.926 kg)       Assessment & Plan:  1. Cough - benzonatate (TESSALON) 100 MG capsule; Take 1-2 capsules (100-200 mg total) by mouth 3 (three) times daily as needed for cough.  Dispense: 40 capsule; Refill: 0 - Care order/instruction  2. Rib contusion, left, initial encounter - cyclobenzaprine (FLEXERIL) 10 MG tablet; 1/2 to 1 tablet at bedtime as needed  Dispense: 15 tablet; Refill: 0 - ibuprofen (ADVIL,MOTRIN) 800 MG tablet; Take 1 tablet (800 mg total) by mouth every 8 (eight) hours as needed.   Dispense: 30 tablet; Refill: 0 - Care order/instruction - encouraged her to use NSAID as sparingly as possible  Olean Reeeborah Zyanna Leisinger, FNP-BC  [facility]  08/10/2015 12:27 PM

## 2015-08-10 NOTE — Patient Instructions (Addendum)
   IF you received an x-ray today, you will receive an invoice from [facility]. Please contact [facility] at [phone] with questions or concerns regarding your invoice.   IF you received labwork today, you will receive an invoice from [facility]. Please contact [facility] at [phone] with questions or concerns regarding your invoice.   Our billing staff will not be able to assist you with questions regarding bills from these companies.  You will be contacted with the lab results as soon as they are available. The fastest way to get your results is to activate your My Chart account. Instructions are located on the last page of this paperwork. If you have not heard from us regarding the results in 2 weeks, please contact this office.     Rib Contusion A rib contusion is a deep bruise on your rib area. Contusions are the result of a blunt trauma that causes bleeding and injury to the tissues under the skin. A rib contusion may involve bruising of the ribs and of the skin and muscles in the area. The skin overlying the contusion may turn blue, purple, or yellow. Minor injuries will give you a painless contusion, but more severe contusions may stay painful and swollen for a few weeks. CAUSES  A contusion is usually caused by a blow, trauma, or direct force to an area of the body. This often occurs while playing contact sports. SYMPTOMS  Swelling and redness of the injured area.  Discoloration of the injured area.  Tenderness and soreness of the injured area.  Pain with or without movement. DIAGNOSIS  The diagnosis can be made by taking a medical history and performing a physical exam. An X-ray, CT scan, or MRI may be needed to determine if there were any associated injuries, such as broken bones (fractures) or internal injuries. TREATMENT  Often, the best treatment for a rib contusion is rest. Icing or applying cold compresses to the  injured area may help reduce swelling and inflammation. Deep breathing exercises may be recommended to reduce the risk of partial lung collapse and pneumonia. Over-the-counter or prescription medicines may also be recommended for pain control. HOME CARE INSTRUCTIONS   Apply ice to the injured area:  Put ice in a plastic bag.  Place a towel between your skin and the bag.  Leave the ice on for 20 minutes, 2-3 times per day.  Take medicines only as directed by your health care provider.  Rest the injured area. Avoid strenuous activity and any activities or movements that cause pain. Be careful during activities and avoid bumping the injured area.  Perform deep-breathing exercises as directed by your health care provider.  Do not lift anything that is heavier than 5 lb (2.3 kg) until your health care provider approves.  Do not use any tobacco products, including cigarettes, chewing tobacco, or electronic cigarettes. If you need help quitting, ask your health care provider. SEEK MEDICAL CARE IF:   You have increased bruising or swelling.  You have pain that is not controlled with treatment.  You have a fever. SEEK IMMEDIATE MEDICAL CARE IF:   You have difficulty breathing or shortness of breath.  You develop a continual cough, or you cough up thick or bloody sputum.  You feel sick to your stomach (nauseous), you throw up (vomit), or you have abdominal pain.   This information is not intended to replace advice given to you by your health care provider. Make sure you discuss any   questions you have with your health care provider.   Document Released: 11/29/2000 Document Revised: 03/27/2014 Document Reviewed: 12/16/2013 Elsevier Interactive Patient Education 2016 Elsevier Inc.  

## 2015-10-21 ENCOUNTER — Ambulatory Visit (INDEPENDENT_AMBULATORY_CARE_PROVIDER_SITE_OTHER): Payer: Managed Care, Other (non HMO) | Admitting: Physician Assistant

## 2015-10-21 ENCOUNTER — Encounter: Payer: Self-pay | Admitting: Physician Assistant

## 2015-10-21 VITALS — BP 126/78 | HR 91 | Temp 98.1°F | Resp 17 | Ht 65.5 in | Wt 173.0 lb

## 2015-10-21 DIAGNOSIS — B029 Zoster without complications: Secondary | ICD-10-CM | POA: Diagnosis not present

## 2015-10-21 MED ORDER — VALACYCLOVIR HCL 1 G PO TABS
1000.0000 mg | ORAL_TABLET | Freq: Three times a day (TID) | ORAL | 0 refills | Status: DC
Start: 2015-10-21 — End: 2018-05-15

## 2015-10-21 NOTE — Progress Notes (Signed)
   10/21/2015 6:05 PM   DOB: [birth year] / MRN: [id number]  SUBJECTIVE:  Kimberly Duke is a 60 y.o. female presenting for a "burning rash" of the left abdomen that started about 34 hours ago and is worsening.  She has taken tylenol with good relief.   She is allergic to amoxicillin and darvon.   She  has a past medical history of Arthritis; GERD (gastroesophageal reflux disease); History of endometriosis; History of hypothyroidism; History of kidney stones; Hyperlipidemia; Osteoporosis; PONV (postoperative nausea and vomiting); Renal calculus, left; Thyroid nodule; Type 2 diabetes mellitus (HCC); Upper airway cough syndrome; and Wears glasses.    She  reports that she has never smoked. She has never used smokeless tobacco. She reports that she does not drink alcohol or use drugs. She  has no sexual activity history on file. The patient  has a past surgical history that includes Tubal ligation (yrs ago); Total abdominal hysterectomy w/ bilateral salpingoophorectomy (1997); Extracorporeal shock wave lithotripsy (Left, x2  last one 1990's); Ureteral reimplantion (Right, 1993 (approx)); Cystoscopy with ureteroscopy and stent placement (Left, 11/19/2013); and Holmium laser application (Left, 11/19/2013).  Her family history includes Breast cancer in her paternal grandmother; COPD in her father; Cancer in her maternal grandfather, paternal grandfather, and paternal grandmother; Cancer (age of onset: 73) in her maternal grandmother; Diabetes in her brother, mother, and sister; Hyperlipidemia in her mother; Hypertension in her brother; Lung cancer in her paternal grandfather.  Review of Systems  Constitutional: Negative for fever.  Respiratory: Negative for cough.   Cardiovascular: Negative for chest pain.  Musculoskeletal: Negative for myalgias.  Skin: Positive for rash. Negative for itching.  Neurological: Negative for dizziness and headaches.    The problem list and medications were reviewed and updated  by myself where necessary and exist elsewhere in the encounter.   OBJECTIVE:  BP 126/78 (BP Location: Left Arm, Patient Position: Sitting, Cuff Size: Normal)   Pulse 91   Temp 98.1 F (36.7 C) (Oral)   Resp 17   Ht 5' 5.5" (1.664 m)   Wt 173 lb (78.5 kg)   SpO2 96%   BMI 28.35 kg/m   Physical Exam  Constitutional: She is oriented to person, place, and time. Vital signs are normal. She appears well-developed.  Cardiovascular: Normal rate.   Pulmonary/Chest: Effort normal.  Abdominal: Soft.  Neurological: She is alert and oriented to person, place, and time.  Skin: Rash (see photo, mildly tender) noted. She is not diaphoretic.      o     No results found for this or any previous visit (from the past 72 hour(s)).  No results found.  ASSESSMENT AND PLAN  Kimberly Duke was seen today for rash.  Diagnoses and all orders for this visit:  Shingles: This is early.  She should do well with Valtrex.  Advised she take tylenol and alternate with OTC NSAID of choice as needed.  RTC if pain or rash significantly worsen.  -     valACYclovir (VALTREX) 1000 MG tablet; Take 1 tablet (1,000 mg total) by mouth 3 (three) times daily.    The patient is advised to call or return to clinic if she does not see an improvement in symptoms, or to seek the care of the closest emergency department if she worsens with the above plan.   Deliah Boston, MHS, PA-C [facility] 10/21/2015 6:05 PM

## 2015-10-21 NOTE — Patient Instructions (Addendum)
IF you received an x-ray today, you will receive an invoice from [facility]. Please contact [facility] at [phone] with questions or concerns regarding your invoice.   IF you received labwork today, you will receive an invoice from [employer]. Please contact [facility] at [phone] with questions or concerns regarding your invoice.   Our billing staff will not be able to assist you with questions regarding bills from these companies.  You will be contacted with the lab results as soon as they are available. The fastest way to get your results is to activate your My Chart account. Instructions are located on the last page of this paperwork. If you have not heard from [place] regarding the results in 2 weeks, please contact this office.     We recommend that you schedule a mammogram for breast cancer screening. Typically, you do not need a referral to do this. Please contact a local imaging center to schedule your mammogram.  [facility] - [phone]  *ask for the Radiology Department [facility]) - [phone] or [phone]  [facility] - [phone] [facility] - [phone] [facility] - [phone]  *ask for the Radiology Department [facility] - [phone]  *ask for the Radiology Department [facility] - [phone]  *ask for the Mammography Department [facility] - [phone]   Shingles Shingles, which is also known as herpes zoster, is an infection that causes a painful skin rash and fluid-filled blisters. Shingles is not related to genital herpes, which is a sexually transmitted infection.   Shingles only develops in people who:  Have had chickenpox.  Have received the chickenpox vaccine. (This is rare.) CAUSES Shingles is caused by varicella-zoster virus (VZV). This is the same virus that causes  chickenpox. After exposure to VZV, the virus stays in the body in an inactive (dormant) state. Shingles develops if the virus reactivates. This can happen many years after the initial exposure to VZV. It is not known what causes this virus to reactivate. RISK FACTORS People who have had chickenpox or received the chickenpox vaccine are at risk for shingles. Infection is more common in people who:  Are older than age 30.  Have a weakened defense (immune) system, such as those with HIV, AIDS, or cancer.  Are taking medicines that weaken the immune system, such as transplant medicines.  Are under great stress. SYMPTOMS Early symptoms of this condition include itching, tingling, and pain in an area on your skin. Pain may be described as burning, stabbing, or throbbing. A few days or weeks after symptoms start, a painful red rash appears, usually on one side of the body in a bandlike or beltlike pattern. The rash eventually turns into fluid-filled blisters that break open, scab over, and dry up in about 2-3 weeks. At any time during the infection, you may also develop:  A fever.  Chills.  A headache.  An upset stomach. DIAGNOSIS This condition is diagnosed with a skin exam. Sometimes, skin or fluid samples are taken from the blisters before a diagnosis is made. These samples are examined under a microscope or sent to a lab for testing. TREATMENT There is no specific cure for this condition. Your health care provider will probably prescribe medicines to help you manage pain, recover more quickly, and avoid long-term problems. Medicines may include:  Antiviral drugs.  Anti-inflammatory drugs.  Pain medicines. If the area  involved is on your face, you may be referred to a specialist, such as an eye doctor (ophthalmologist) or an ear, nose, and throat (ENT) doctor to help you avoid eye problems, chronic pain, or disability. HOME CARE INSTRUCTIONS Medicines  Take medicines only as directed  by your health care provider.  Apply an anti-itch or numbing cream to the affected area as directed by your health care provider. Blister and Rash Care  Take a cool bath or apply cool compresses to the area of the rash or blisters as directed by your health care provider. This may help with pain and itching.  Keep your rash covered with a loose bandage (dressing). Wear loose-fitting clothing to help ease the pain of material rubbing against the rash.  Keep your rash and blisters clean with mild soap and cool water or as directed by your health care provider.  Check your rash every day for signs of infection. These include redness, swelling, and pain that lasts or increases.  Do not pick your blisters.  Do not scratch your rash. General Instructions  Rest as directed by your health care provider.  Keep all follow-up visits as directed by your health care provider. This is important.  Until your blisters scab over, your infection can cause chickenpox in people who have never had it or been vaccinated against it. To prevent this from happening, avoid contact with other people, especially:  Babies.  Pregnant women.  Children who have eczema.  Elderly people who have transplants.  People who have chronic illnesses, such as leukemia or AIDS. SEEK MEDICAL CARE IF:  Your pain is not relieved with prescribed medicines.  Your pain does not get better after the rash heals.  Your rash looks infected. Signs of infection include redness, swelling, and pain that lasts or increases. SEEK IMMEDIATE MEDICAL CARE IF:  The rash is on your face or nose.  You have facial pain, pain around your eye area, or loss of feeling on one side of your face.  You have ear pain or you have ringing in your ear.  You have loss of taste.  Your condition gets worse.   This information is not intended to replace advice given to you by your health care provider. Make sure you discuss any questions you  have with your health care provider.   Document Released: 03/06/2005 Document Revised: 03/27/2014 Document Reviewed: 01/15/2014 Elsevier Interactive Patient Education [employer]

## 2016-12-04 ENCOUNTER — Other Ambulatory Visit: Payer: Self-pay | Admitting: Obstetrics & Gynecology

## 2016-12-04 DIAGNOSIS — Z1231 Encounter for screening mammogram for malignant neoplasm of breast: Secondary | ICD-10-CM

## 2017-03-15 ENCOUNTER — Ambulatory Visit
Admission: RE | Admit: 2017-03-15 | Discharge: 2017-03-15 | Disposition: A | Discharge status: Home / Self Care | Payer: 59 | Source: Ambulatory Visit | Attending: Obstetrics & Gynecology | Admitting: Obstetrics & Gynecology

## 2017-03-15 DIAGNOSIS — Z1231 Encounter for screening mammogram for malignant neoplasm of breast: Secondary | ICD-10-CM

## 2017-03-16 ENCOUNTER — Other Ambulatory Visit: Payer: Self-pay | Admitting: Obstetrics & Gynecology

## 2017-03-16 DIAGNOSIS — R928 Other abnormal and inconclusive findings on diagnostic imaging of breast: Secondary | ICD-10-CM

## 2017-04-05 ENCOUNTER — Other Ambulatory Visit: Payer: Self-pay | Admitting: Obstetrics & Gynecology

## 2017-04-05 ENCOUNTER — Ambulatory Visit
Admission: RE | Admit: 2017-04-05 | Discharge: 2017-04-05 | Disposition: A | Discharge status: Home / Self Care | Payer: 59 | Source: Ambulatory Visit | Attending: Obstetrics & Gynecology | Admitting: Obstetrics & Gynecology

## 2017-04-05 DIAGNOSIS — R928 Other abnormal and inconclusive findings on diagnostic imaging of breast: Secondary | ICD-10-CM

## 2017-04-10 ENCOUNTER — Ambulatory Visit
Admission: RE | Admit: 2017-04-10 | Discharge: 2017-04-10 | Disposition: A | Discharge status: Home / Self Care | Payer: 59 | Source: Ambulatory Visit | Attending: Obstetrics & Gynecology | Admitting: Obstetrics & Gynecology

## 2017-04-10 ENCOUNTER — Other Ambulatory Visit: Payer: Self-pay | Admitting: Obstetrics & Gynecology

## 2017-04-10 DIAGNOSIS — R928 Other abnormal and inconclusive findings on diagnostic imaging of breast: Secondary | ICD-10-CM

## 2017-04-29 ENCOUNTER — Other Ambulatory Visit: Payer: Self-pay | Admitting: Endocrinology

## 2017-04-29 DIAGNOSIS — R590 Localized enlarged lymph nodes: Secondary | ICD-10-CM

## 2018-04-13 ENCOUNTER — Other Ambulatory Visit: Payer: Self-pay | Admitting: Cardiology

## 2018-04-13 DIAGNOSIS — R002 Palpitations: Secondary | ICD-10-CM

## 2018-04-13 DIAGNOSIS — I10 Essential (primary) hypertension: Secondary | ICD-10-CM

## 2018-05-07 ENCOUNTER — Ambulatory Visit: Payer: Managed Care, Other (non HMO)

## 2018-05-07 DIAGNOSIS — I10 Essential (primary) hypertension: Secondary | ICD-10-CM

## 2018-05-07 DIAGNOSIS — R002 Palpitations: Secondary | ICD-10-CM

## 2018-05-12 NOTE — Progress Notes (Signed)
Patient is here for follow up visit.  Subjective:   Kimberly Duke, female    DOB: 01/27/1956, 63 y.o.   MRN: [id number]   No chief complaint on file.    HPI  63 y/o Caucasian female with hypertension, type 2 DM, with occasional palpitations.  Patient was last seen in 03/2018. She was started on diltiazem 120 mg daily. Workup showed structurally normal heart.   Patient is here for follow up today.  She has not had any recurrence of palpitation symptoms.  She is compliant with medical therapy.  Blood pressures well controlled.    Past Medical History:  Diagnosis Date  . Arthritis   . GERD (gastroesophageal reflux disease)    without acid reflux  . History of endometriosis   . History of hypothyroidism   . History of kidney stones   . Hyperlipidemia   . Osteoporosis   . PONV (postoperative nausea and vomiting)   . Renal calculus, left   . Thyroid nodule    monitored by dr balan-  negartive bx's  . Type 2 diabetes mellitus ([place])   . Upper airway cough syndrome    pulmologist-   dr wert  . Wears glasses      Past Surgical History:  Procedure Laterality Date  . CYSTOSCOPY WITH URETEROSCOPY AND STENT PLACEMENT Left 11/19/2013   Procedure: CYSTOSCOPY WITH URETEROSCOPY , RETROGRADE AND STENT PLACEMENT, STONE EXTRACTION;  Surgeon: Alexis Frock, MD;  Location: [facility];  Service: Urology;  Laterality: Left;  . EXTRACORPOREAL SHOCK WAVE LITHOTRIPSY Left x2  last one 1990's  . HOLMIUM LASER APPLICATION Left 03/24/7260   Procedure: HOLMIUM LASER APPLICATION;  Surgeon: Alexis Frock, MD;  Location: [facility];  Service: Urology;  Laterality: Left;  . TOTAL ABDOMINAL HYSTERECTOMY W/ BILATERAL SALPINGOOPHORECTOMY  1997  . TUBAL LIGATION  yrs ago  . URETERAL REIMPLANTION Right 1993 (approx)   w/  endometriosis resectED from ureter     Social History   Socioeconomic History  . Marital status: Married    Spouse name: Not on file  . Number  of children: Not on file  . Years of education: Not on file  . Highest education level: Not on file  Occupational History  . Not on file  Social Needs  . Financial resource strain: Not on file  . Food insecurity:    Worry: Not on file    Inability: Not on file  . Transportation needs:    Medical: Not on file    Non-medical: Not on file  Tobacco Use  . Smoking status: Never Smoker  . Smokeless tobacco: Never Used  Substance and Sexual Activity  . Alcohol use: No  . Drug use: No  . Sexual activity: Not on file  Lifestyle  . Physical activity:    Days per week: Not on file    Minutes per session: Not on file  . Stress: Not on file  Relationships  . Social connections:    Talks on phone: Not on file    Gets together: Not on file    Attends religious service: Not on file    Active member of club or organization: Not on file    Attends meetings of clubs or organizations: Not on file    Relationship status: Not on file  . Intimate partner violence:    Fear of current or ex partner: Not on file    Emotionally abused: Not on file    Physically abused: Not  on file    Forced sexual activity: Not on file  Other Topics Concern  . Not on file  Social History Narrative  . Not on file     Current Outpatient Medications on File Prior to Visit  Medication Sig Dispense Refill  . benzonatate (TESSALON) 100 MG capsule Take 1-2 capsules (100-200 mg total) by mouth 3 (three) times daily as needed for cough. 40 capsule 0  . cyclobenzaprine (FLEXERIL) 10 MG tablet 1/2 to 1 tablet at bedtime as needed (Patient not taking: Reported on 10/21/2015) 15 tablet 0  . fenofibrate 160 MG tablet Take 160 mg by mouth daily.    Marland Kitchen glimepiride (AMARYL) 4 MG tablet Take 4 mg by mouth 2 (two) times daily.    Marland Kitchen ibuprofen (ADVIL,MOTRIN) 800 MG tablet Take 1 tablet (800 mg total) by mouth every 8 (eight) hours as needed. 30 tablet 0  . ipratropium (ATROVENT) 0.03 % nasal spray Place 2 sprays into both nostrils  2 (two) times daily. (Patient not taking: Reported on 10/21/2015) 30 mL 0  . LANTUS SOLOSTAR 100 UNIT/ML Solostar Pen ADM 20 TO 28 UNITS [place] QD HS  6  . loratadine (CLARITIN) 10 MG tablet Take 10 mg by mouth daily.    . metFORMIN (GLUCOPHAGE) 500 MG tablet Take 1,000 mg by mouth 2 (two) times daily.     . Multiple Vitamin (MULTIVITAMIN) tablet Take 1 tablet by mouth daily.    . psyllium (METAMUCIL) 58.6 % powder Take 1 packet by mouth daily.    . valACYclovir (VALTREX) 1000 MG tablet Take 1 tablet (1,000 mg total) by mouth 3 (three) times daily. 30 tablet 0   No current facility-administered medications on file prior to visit.     Cardiovascular studies:   EKG 04/08/2018: Sinus rhytrhm 83 bpm. Normal EKG.  Echocardiogram 05/08/2018: Left ventricle cavity is normal in size. Moderate concentric hypertrophy of the left ventricle. Normal global wall motion. Doppler evidence of grade I (impaired) diastolic dysfunction, normal LAP. Calculated EF 55%. No hemodynamically significant valvular abnormality. Normal right atrial pressure.  Labs 02/21/2018:  Glucose 121 BUN/creatinine 17/1.07 EGFR 56.  Sodium 142, potassium 5.6.  Rest of the CMP normal Hemoglobin A1c 7.6% Cholesterol 176, triglycerides 221, HDL 39, LDL 93 TSH 2.0 normal   Review of Systems  Constitution: Negative for decreased appetite, malaise/fatigue, weight gain and weight loss.  HENT: Negative for congestion.   Eyes: Negative for visual disturbance.  Cardiovascular: Positive for palpitations. Negative for chest pain, dyspnea on exertion, leg swelling and syncope.  Respiratory: Negative for shortness of breath.   Endocrine: Negative for cold intolerance.  Hematologic/Lymphatic: Does not bruise/bleed easily.  Skin: Negative for itching and rash.  Musculoskeletal: Negative for myalgias.  Gastrointestinal: Negative for abdominal pain, nausea and vomiting.  Genitourinary: Negative for dysuria.  Neurological: Negative for  dizziness and weakness.  Psychiatric/Behavioral: The patient is not nervous/anxious.   All other systems reviewed and are negative.      Objective:    Vitals:   05/15/18 1602  BP: 138/73  Pulse: 85  SpO2: 97%     Physical Exam  Constitutional: She is oriented to person, place, and time. She appears well-developed and well-nourished. No distress.  HENT:  Head: Normocephalic and atraumatic.  Eyes: Pupils are equal, round, and reactive to light. Conjunctivae are normal.  Neck: No JVD present.  Cardiovascular: Normal rate, regular rhythm and intact distal pulses.  No murmur heard. Pulmonary/Chest: Effort normal and breath sounds normal. She has no wheezes. She  has no rales.  Abdominal: Soft. Bowel sounds are normal. There is no rebound.  Musculoskeletal:        General: No edema.  Lymphadenopathy:    She has no cervical adenopathy.  Neurological: She is alert and oriented to person, place, and time. No cranial nerve deficit.  Skin: Skin is warm and dry.  Psychiatric: She has a normal mood and affect.  Nursing note and vitals reviewed.       Assessment & Recommendations:   63 y/o Caucasian female with hypertension, type 2 DM, with occasional palpitations.   Palpitations: Likely benign PAC/PVC. Symptoms re currently resolved. Recommend event monitor, only for severe symptoms in the future. Echocardiogram showed structurally normal heart. Continue diltiazem 120 mg daily for palpitations and BP control.  Hypertension: Continue losaratan 100 mg daily, diltiazem 120 mg daily.  Type 2 DM, hyperlipidemia: Managed by Dr. Chalmers Cater.  I will see her back in 1 year.   Nigel Mormon, MD [facility] Pager: [phone] Office: [phone] If no answer Cell [phone]

## 2018-05-15 ENCOUNTER — Ambulatory Visit (INDEPENDENT_AMBULATORY_CARE_PROVIDER_SITE_OTHER): Payer: Managed Care, Other (non HMO) | Admitting: Cardiology

## 2018-05-15 ENCOUNTER — Encounter: Payer: Self-pay | Admitting: Cardiology

## 2018-05-15 VITALS — BP 138/73 | HR 85 | Ht 65.5 in | Wt 171.0 lb

## 2018-05-15 DIAGNOSIS — R002 Palpitations: Secondary | ICD-10-CM

## 2018-05-15 DIAGNOSIS — I10 Essential (primary) hypertension: Secondary | ICD-10-CM

## 2018-05-15 MED ORDER — DILTIAZEM HCL ER BEADS 120 MG PO CP24: 120.0000 mg | ORAL_CAPSULE | Freq: Every day | ORAL | 3 refills | Status: DC

## 2018-11-15 ENCOUNTER — Other Ambulatory Visit: Payer: Self-pay | Admitting: Family Medicine

## 2018-11-15 DIAGNOSIS — Z1231 Encounter for screening mammogram for malignant neoplasm of breast: Secondary | ICD-10-CM

## 2018-12-27 ENCOUNTER — Other Ambulatory Visit: Payer: Self-pay

## 2018-12-27 ENCOUNTER — Ambulatory Visit
Admission: RE | Admit: 2018-12-27 | Discharge: 2018-12-27 | Disposition: A | Discharge status: Home / Self Care | Payer: Managed Care, Other (non HMO) | Source: Ambulatory Visit | Attending: Family Medicine | Admitting: Family Medicine

## 2018-12-27 DIAGNOSIS — Z1231 Encounter for screening mammogram for malignant neoplasm of breast: Secondary | ICD-10-CM

## 2019-05-12 ENCOUNTER — Encounter: Payer: Self-pay | Admitting: Cardiology

## 2019-05-12 ENCOUNTER — Ambulatory Visit: Payer: Managed Care, Other (non HMO) | Admitting: Cardiology

## 2019-05-12 ENCOUNTER — Other Ambulatory Visit: Payer: Self-pay

## 2019-05-12 VITALS — BP 166/80 | HR 87 | Temp 98.0°F | Ht 65.0 in | Wt 179.9 lb

## 2019-05-12 DIAGNOSIS — I10 Essential (primary) hypertension: Secondary | ICD-10-CM

## 2019-05-12 DIAGNOSIS — R002 Palpitations: Secondary | ICD-10-CM

## 2019-05-12 NOTE — Progress Notes (Signed)
Patient is here for follow up visit.  Subjective:   Kimberly Duke, female    DOB: 22-Aug-1955, 64 y.o.   MRN: [id number]   Chief Complaint  Patient presents with  . Palpitations  . Follow-up    1 year     HPI  64 y/o Caucasian female with hypertension, type 2 DM, with occasional palpitations.  Patient was last seen in 04/2018. She was started on diltiazem 120 mg daily. Workup showed structurally normal heart.   She denies chest pain, shortness of breath, palpitations, leg edema, orthopnea, PND, TIA/syncope. She lost her job in October. She does not check BP regularly. Last it was checked was at her PCP visit in 01/2019, and was reportedly normal.    Current Outpatient Medications on File Prior to Visit  Medication Sig Dispense Refill  . diltiazem (TIAZAC) 120 MG 24 hr capsule Take 1 capsule (120 mg total) by mouth daily. 90 capsule 3  . fenofibrate 160 MG tablet Take 160 mg by mouth daily.    Marland Kitchen glimepiride (AMARYL) 4 MG tablet Take 4 mg by mouth 2 (two) times daily.    Marland Kitchen ibuprofen (ADVIL,MOTRIN) 800 MG tablet Take 1 tablet (800 mg total) by mouth every 8 (eight) hours as needed. 30 tablet 0  . LANTUS SOLOSTAR 100 UNIT/ML Solostar Pen Inject 35 Units into the skin daily.   6  . loratadine (CLARITIN) 10 MG tablet Take 10 mg by mouth daily.    Marland Kitchen losartan (COZAAR) 100 MG tablet Take 100 mg by mouth daily.    . metFORMIN (GLUCOPHAGE) 500 MG tablet Take 1,000 mg by mouth 2 (two) times daily.     . Multiple Vitamin (MULTIVITAMIN) tablet Take 1 tablet by mouth daily.     No current facility-administered medications on file prior to visit.    Cardiovascular studies:   EKG 04/08/2018: Sinus rhytrhm 83 bpm. Normal EKG.  Echocardiogram 05/08/2018: Left ventricle cavity is normal in size. Moderate concentric hypertrophy of the left ventricle. Normal global wall motion. Doppler evidence of grade I (impaired) diastolic dysfunction, normal LAP. Calculated EF 55%. No hemodynamically  significant valvular abnormality. Normal right atrial pressure.  Labs 02/21/2018:  Glucose 121 BUN/creatinine 17/1.07 EGFR 56.  Sodium 142, potassium 5.6.  Rest of the CMP normal Hemoglobin A1c 7.6% Cholesterol 176, triglycerides 221, HDL 39, LDL 93 TSH 2.0 normal   Review of Systems  Cardiovascular: Positive for palpitations. Negative for chest pain, dyspnea on exertion, leg swelling and syncope.       Objective:    Vitals:   05/12/19 1434  BP: (!) 166/80  Pulse: 87  Temp: 98 F (36.7 C)  SpO2: 98%     Physical Exam  Constitutional: She appears well-developed and well-nourished.  Neck: No JVD present.  Cardiovascular: Normal rate, regular rhythm, normal heart sounds and intact distal pulses.  No murmur heard. Pulmonary/Chest: Effort normal and breath sounds normal. She has no wheezes. She has no rales.  Musculoskeletal:        General: No edema.  Nursing note and vitals reviewed.       Assessment & Recommendations:   64 y/o Caucasian female with hypertension, type 2 DM, with occasional palpitations.   Palpitations: Likely benign PAC/PVC. Symptoms re currently resolved. Echocardiogram showed structurally normal heart. Continue diltiazem 120 mg daily for palpitations and BP control.  Hypertension: Continue losaratan 100 mg daily, diltiazem 120 mg daily. Patient knows to check her BP regularly at home. If SBP consistently >150 mmHg, increase  diltiazem to 240 mg daily.   Type 2 DM, hyperlipidemia: Managed by Dr. Chalmers Cater.  I will see her on as needed basis.   Nigel Mormon, MD [facility] Pager: [phone] Office: [phone] If no answer Cell [phone]

## 2019-06-02 ENCOUNTER — Telehealth: Payer: Self-pay

## 2019-06-02 NOTE — Telephone Encounter (Signed)
Telephone encounter:  Reason for call: Patient called [facility] said that her BP has been running 148 84 and 147 84, shouls she double up her meds or keep the same. ?  Usual provider: MP  Last office visit: 05/12/19  Next office visit: NA   Last hospitalization: NA Current Outpatient Medications on File Prior to Visit  Medication Sig Dispense Refill  . diltiazem (TIAZAC) 120 MG 24 hr capsule Take 1 capsule (120 mg total) by mouth daily. 90 capsule 3  . fenofibrate 160 MG tablet Take 160 mg by mouth daily.    Marland Kitchen glimepiride (AMARYL) 4 MG tablet Take 4 mg by mouth 2 (two) times daily.    Marland Kitchen ibuprofen (ADVIL,MOTRIN) 800 MG tablet Take 1 tablet (800 mg total) by mouth every 8 (eight) hours as needed. 30 tablet 0  . LANTUS SOLOSTAR 100 UNIT/ML Solostar Pen Inject 35 Units into the skin daily.   6  . loratadine (CLARITIN) 10 MG tablet Take 10 mg by mouth daily.    Marland Kitchen losartan (COZAAR) 100 MG tablet Take 100 mg by mouth daily.    . metFORMIN (GLUCOPHAGE) 500 MG tablet Take 1,000 mg by mouth 2 (two) times daily.     . Multiple Vitamin (MULTIVITAMIN) tablet Take 1 tablet by mouth daily.     No current facility-administered medications on file prior to visit.

## 2019-06-02 NOTE — Telephone Encounter (Signed)
Increase diltiazem to 240 mg daily. I will be seeing her on as needed basis. Needs to follow up with PCP re: BP control.  Thanks MJP

## 2019-06-05 MED ORDER — DILTIAZEM HCL ER COATED BEADS 240 MG PO CP24: 240.0000 mg | ORAL_CAPSULE | Freq: Every day | ORAL | 0 refills | Status: DC

## 2019-06-05 NOTE — Telephone Encounter (Signed)
Spoke with patient informed her of Diltiazem increase to 240mg  and to follow up with her PCP regarding blood pressure  blood pressure control.

## 2019-09-19 ENCOUNTER — Other Ambulatory Visit: Payer: Self-pay

## 2019-09-19 MED ORDER — DILTIAZEM HCL ER COATED BEADS 240 MG PO CP24: 240.0000 mg | ORAL_CAPSULE | Freq: Every day | ORAL | 2 refills | Status: DC

## 2020-03-06 IMAGING — MG DIGITAL SCREENING BILAT W/ CAD
4 series · 4 of 4 positions shown · non-contrast
Comparison: Previous exam(s).

CLINICAL DATA: Screening.

EXAM:
DIGITAL SCREENING BILATERAL MAMMOGRAM WITH CAD

[R CC]
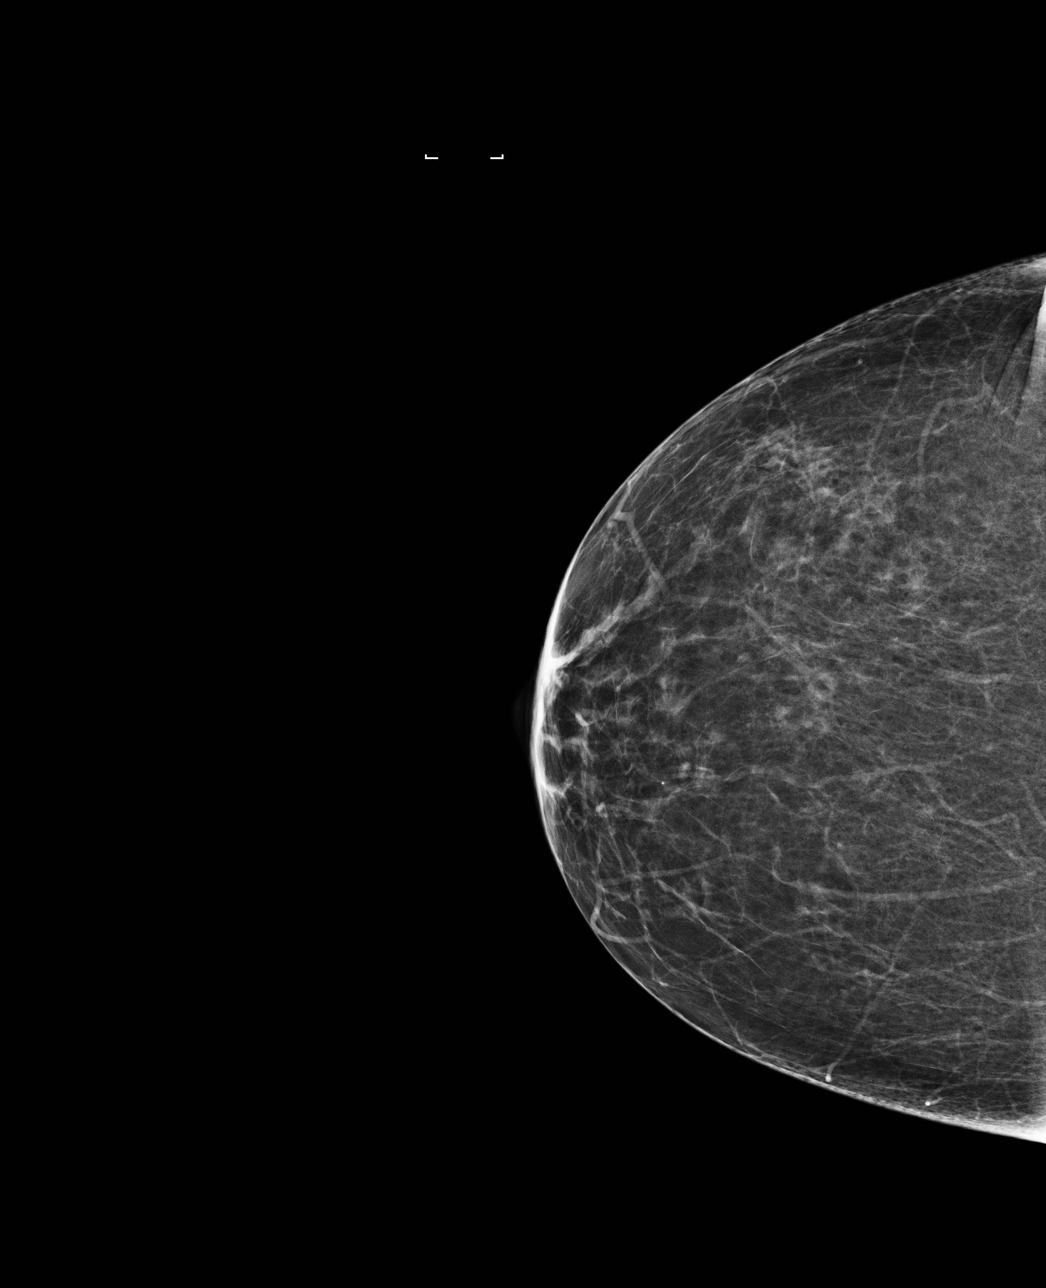

[L CC]
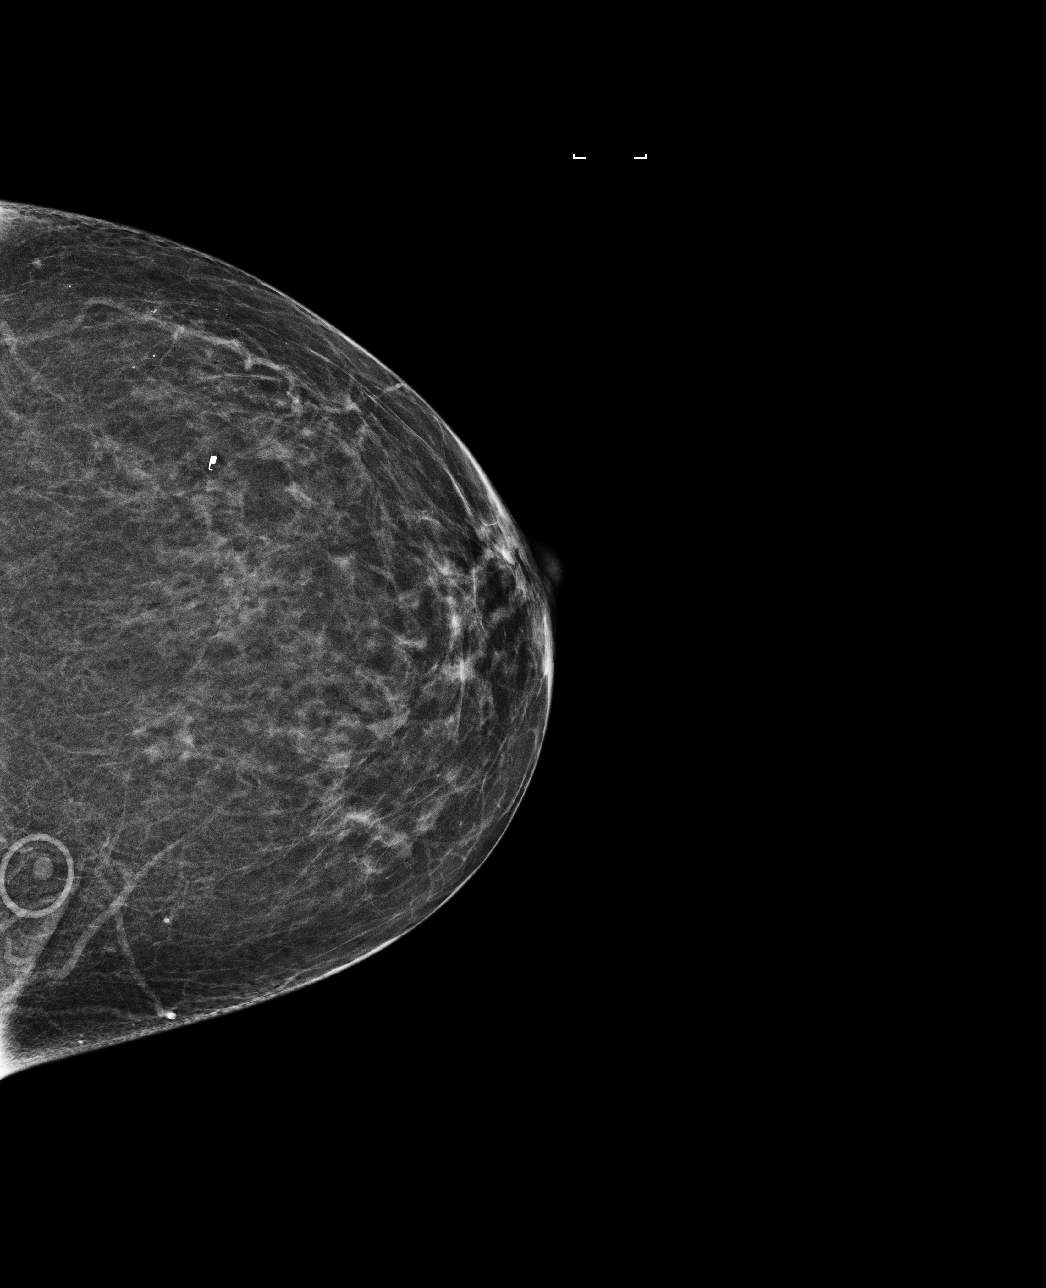

[R MLO]
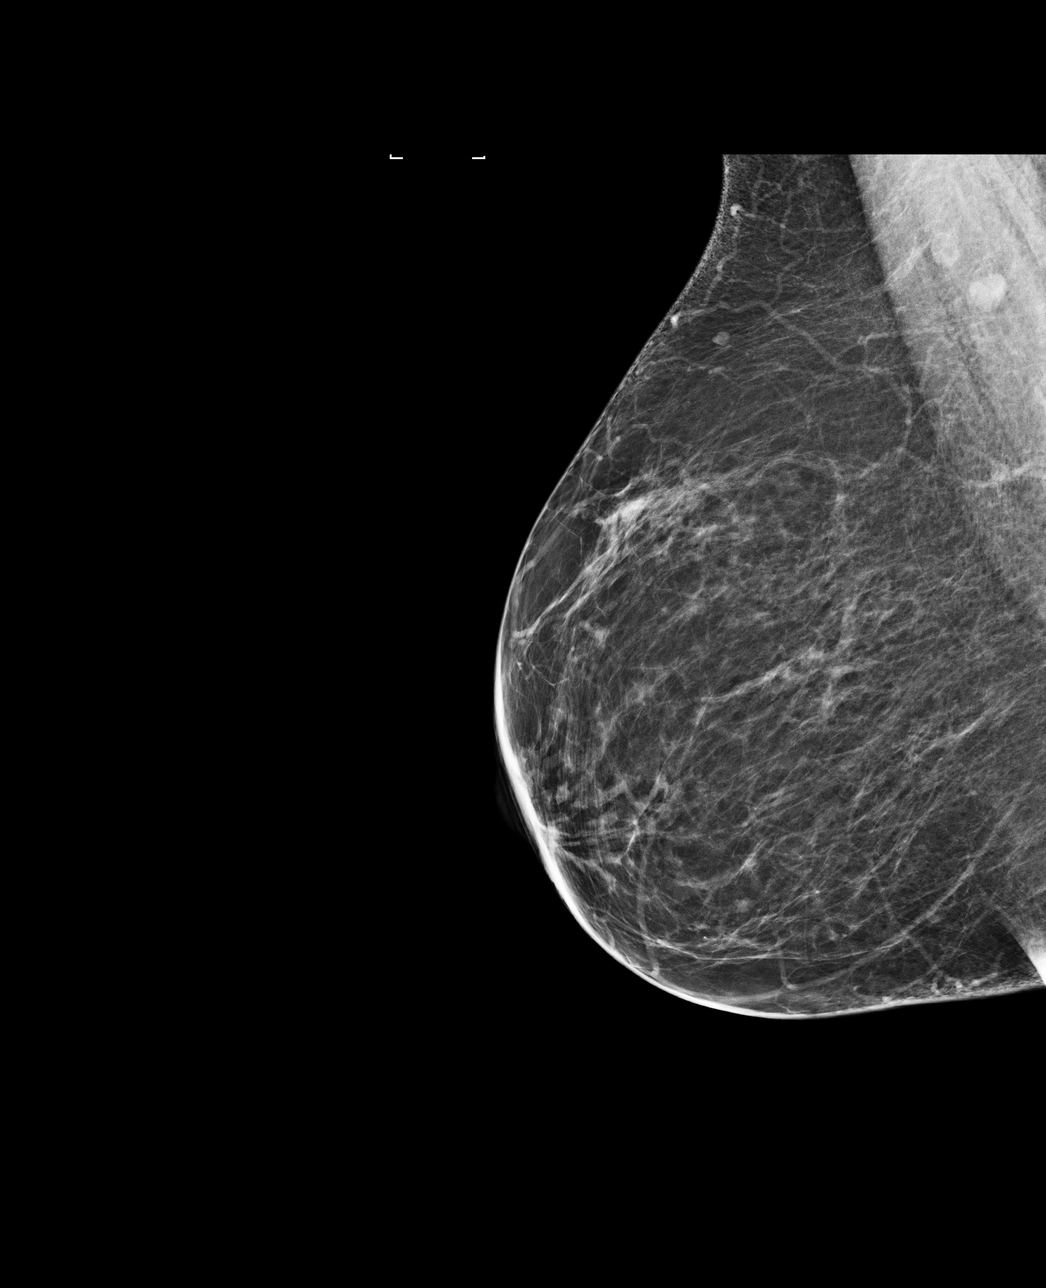

[L MLO]
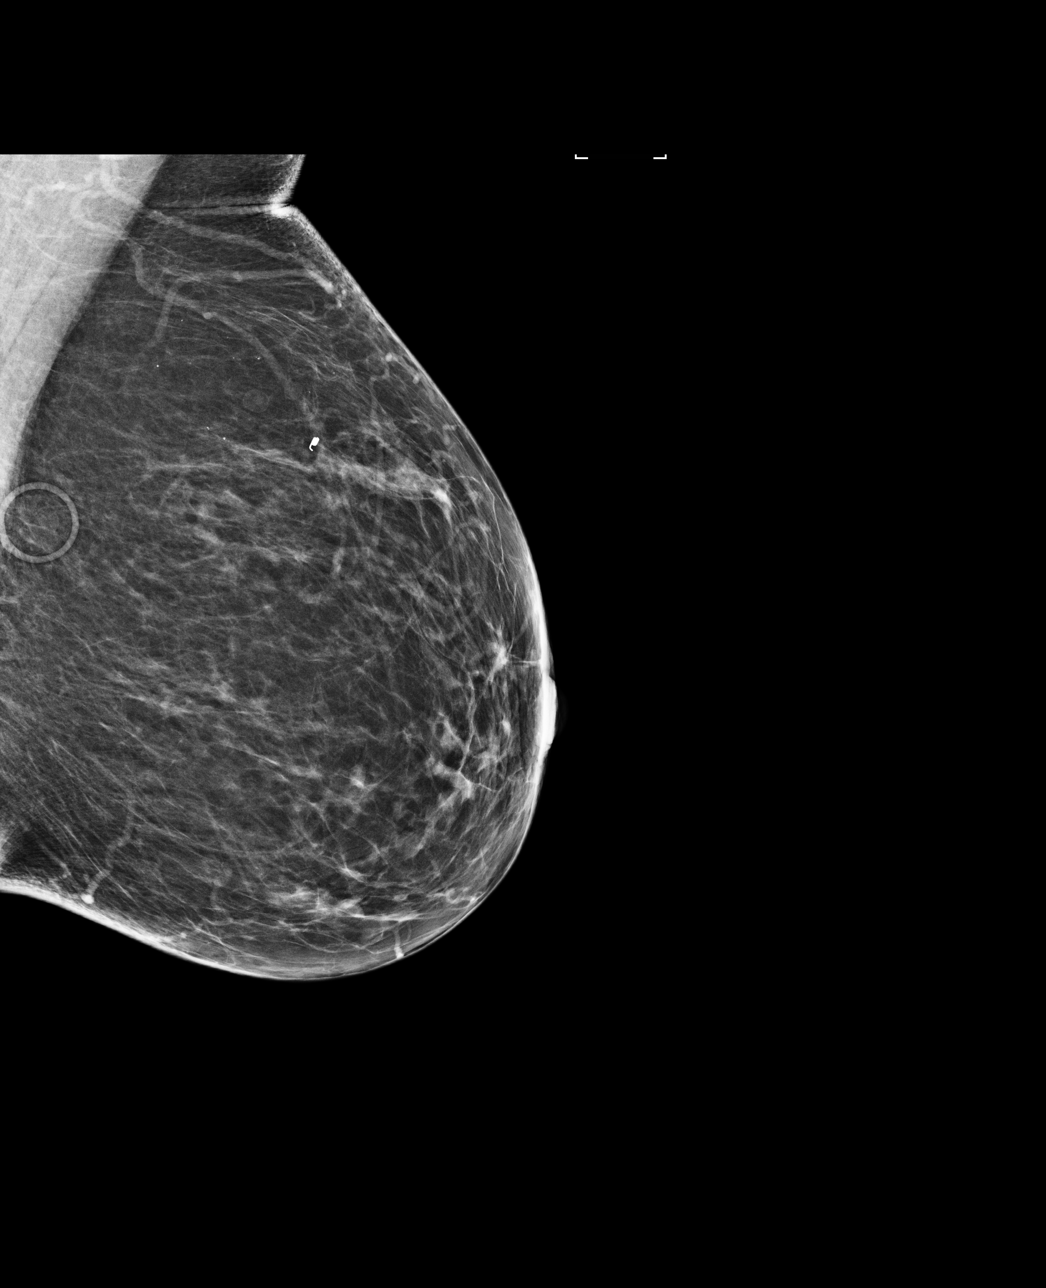

[4 of 4 positions shown; findings below may reference images not displayed]

ACR Breast Density Category b: There are scattered areas of
fibroglandular density.
FINDINGS: There are no findings suspicious for malignancy. Images were
processed with CAD.
IMPRESSION: No mammographic evidence of malignancy. A result letter of this
screening mammogram will be mailed directly to the patient.

RECOMMENDATION:
Screening mammogram in one year. (Code:AS-G-LCT)

BI-RADS CATEGORY  1: Negative.

## 2020-06-07 ENCOUNTER — Ambulatory Visit (INDEPENDENT_AMBULATORY_CARE_PROVIDER_SITE_OTHER): Payer: Medicare Other | Admitting: Pulmonary Disease

## 2020-06-07 ENCOUNTER — Ambulatory Visit (INDEPENDENT_AMBULATORY_CARE_PROVIDER_SITE_OTHER): Payer: Medicare Other

## 2020-06-07 ENCOUNTER — Telehealth: Payer: Self-pay | Admitting: Pulmonary Disease

## 2020-06-07 ENCOUNTER — Other Ambulatory Visit: Payer: Self-pay

## 2020-06-07 ENCOUNTER — Encounter: Payer: Self-pay | Admitting: Pulmonary Disease

## 2020-06-07 VITALS — BP 144/86 | HR 78 | Temp 97.6°F | Ht 67.0 in | Wt 176.6 lb

## 2020-06-07 DIAGNOSIS — R053 Chronic cough: Secondary | ICD-10-CM

## 2020-06-07 MED ORDER — IPRATROPIUM BROMIDE 0.03 % NA SOLN: 2.0000 | Freq: Two times a day (BID) | NASAL | 12 refills | Status: DC

## 2020-06-07 MED ORDER — FLUTICASONE PROPIONATE 50 MCG/ACT NA SUSP: 1.0000 | Freq: Every day | NASAL | 2 refills | Status: DC

## 2020-06-07 MED ORDER — ALBUTEROL SULFATE HFA 108 (90 BASE) MCG/ACT IN AERS: 1.0000 | INHALATION_SPRAY | Freq: Four times a day (QID) | RESPIRATORY_TRACT | 2 refills | Status: DC | PRN

## 2020-06-07 NOTE — Progress Notes (Signed)
Synopsis: Referred in March 2022 for cough, self referred  Subjective:   PATIENT ID: Kimberly Duke GENDER: female DOB: [birth year], MRN: [id number]   HPI  Chief Complaint  Patient presents with  . Consult    Chronic Cough   Kimberly Duke is a 65 year old woman, never smoker with GERD who presents to pulmonary clinic for chronic cough.   She was seen by Dr. Mellody Drown in 2015 and was treated for upper airway cough syndrome. She was treated for GERD and any rhinitis/allergy component. She did not note improvement in her cough with these treatments.   She has stopped the PPI and H2 blocker therapy and denies issues with heartburn. She is also not taking her loratidine on a daily basis but has noticed an increase in her seasonal allergy symptoms with itchy eyes and runny nose.   She does have post-nasal drainage currently. She reports she has coughing episodes now compared to a consistent cough throughout the day. The coughing episodes are dry without sputum production. She was around her brother recently who is a smoker and she reports increased irritation of her throat and cough.  The coughing episodes can be triggered by exertion or talking.   She reports tessalon perles have helped with the cough in the past.   She reports being intubated in the past for an emergency while she was pregnant and reports her throat was irritated at that time from the intubation.   She denies nighttime awakenings due to the cough.  Past Medical History:  Diagnosis Date  . Arthritis   . GERD (gastroesophageal reflux disease)    without acid reflux  . History of endometriosis   . History of hypothyroidism   . History of kidney stones   . Hyperlipidemia   . Hypertension   . Osteoporosis   . PONV (postoperative nausea and vomiting)   . Renal calculus, left   . Thyroid nodule    monitored by dr balan-  negartive bx's  . Type 2 diabetes mellitus (HCC)   . Upper airway cough syndrome    pulmologist-    dr wert  . Wears glasses      Family History  Problem Relation Age of Onset  . Diabetes Mother   . Hyperlipidemia Mother   . COPD Father        smoked  . Diabetes Sister   . Breast cancer Paternal Grandmother   . Cancer Paternal Grandmother        breast cancer (older onset)  . Lung cancer Paternal Grandfather        smoked  . Cancer Paternal Grandfather        lung (smoker)  . Diabetes Brother   . Hypertension Brother   . Cancer Maternal Grandmother 83       breast cancer  . Cancer Maternal Grandfather        lung cancer     Social History   Socioeconomic History  . Marital status: Married    Spouse name: Not on file  . Number of children: 1  . Years of education: Not on file  . Highest education level: Not on file  Occupational History  . Not on file  Tobacco Use  . Smoking status: Never Smoker  . Smokeless tobacco: Never Used  Substance and Sexual Activity  . Alcohol use: No  . Drug use: No  . Sexual activity: Not on file  Other Topics Concern  . Not on file  Social History  Narrative  . Not on file   Social Determinants of Health   Financial Resource Strain: Not on file  Food Insecurity: Not on file  Transportation Needs: Not on file  Physical Activity: Not on file  Stress: Not on file  Social Connections: Not on file  Intimate Partner Violence: Not on file     Allergies  Allergen Reactions  . Amoxicillin Hives  . Darvon Nausea And Vomiting     Outpatient Medications Prior to Visit  Medication Sig Dispense Refill  . diltiazem (TIAZAC) 240 MG 24 hr capsule Take by mouth.    . fenofibrate 160 MG tablet Take 160 mg by mouth daily.    Marland Kitchen. glimepiride (AMARYL) 4 MG tablet Take 4 mg by mouth 2 (two) times daily.    Marland Kitchen. ibuprofen (ADVIL,MOTRIN) 800 MG tablet Take 1 tablet (800 mg total) by mouth every 8 (eight) hours as needed. 30 tablet 0  . LANTUS SOLOSTAR 100 UNIT/ML Solostar Pen Inject 35 Units into the skin daily.   6  . loratadine (CLARITIN) 10 MG  tablet Take 10 mg by mouth daily.    Marland Kitchen. losartan (COZAAR) 100 MG tablet Take 100 mg by mouth daily.    . metFORMIN (GLUCOPHAGE) 500 MG tablet Take 1,000 mg by mouth 2 (two) times daily.     . Multiple Vitamin (MULTIVITAMIN) tablet Take 1 tablet by mouth daily.    Marland Kitchen. diltiazem (CARDIZEM CD) 240 MG 24 hr capsule Take 1 capsule (240 mg total) by mouth daily. Follow up with PCP 90 capsule 2   No facility-administered medications prior to visit.    Review of Systems  Constitutional: Negative for chills, fever, malaise/fatigue and weight loss.  HENT: Positive for congestion (post-nasal drainage). Negative for sinus pain and sore throat.   Eyes: Negative.   Respiratory: Positive for cough. Negative for hemoptysis, sputum production, shortness of breath and wheezing.   Cardiovascular: Negative for chest pain, palpitations, orthopnea, claudication and leg swelling.  Gastrointestinal: Negative for abdominal pain, heartburn, nausea and vomiting.  Genitourinary: Negative.   Musculoskeletal: Negative for joint pain and myalgias.  Skin: Negative for rash.  Neurological: Negative for weakness.  Endo/Heme/Allergies: Negative.   Psychiatric/Behavioral: Negative.       Objective:   Vitals:   06/07/20 1114  BP: (!) 144/86  Pulse: 78  Temp: 97.6 F (36.4 C)  TempSrc: Oral  SpO2: 99%  Weight: 176 lb 10.1 oz (80.1 kg)  Height: 5\' 7"  (1.702 m)     Physical Exam Constitutional:      General: She is not in acute distress.    Appearance: Normal appearance. She is not ill-appearing.  HENT:     Head: Normocephalic and atraumatic.     Nose: Nose normal.     Mouth/Throat:     Mouth: Mucous membranes are moist.     Pharynx: Posterior oropharyngeal erythema (mild) present.  Eyes:     General: No scleral icterus.    Conjunctiva/sclera: Conjunctivae normal.     Pupils: Pupils are equal, round, and reactive to light.  Cardiovascular:     Rate and Rhythm: Normal rate and regular rhythm.      Pulses: Normal pulses.     Heart sounds: Normal heart sounds. No murmur heard.   Pulmonary:     Effort: Pulmonary effort is normal.     Breath sounds: Normal breath sounds. No wheezing, rhonchi or rales.  Abdominal:     General: Bowel sounds are normal.     Palpations: Abdomen is  soft.  Musculoskeletal:     Right lower leg: No edema.     Left lower leg: No edema.  Lymphadenopathy:     Cervical: No cervical adenopathy.  Skin:    General: Skin is warm and dry.  Neurological:     General: No focal deficit present.     Mental Status: She is alert.  Psychiatric:        Mood and Affect: Mood normal.        Behavior: Behavior normal.        Thought Content: Thought content normal.        Judgment: Judgment normal.     CBC    Component Value Date/Time   WBC 10.0 09/26/2014 1159   RBC 4.20 09/26/2014 1159   HGB 11.9 (L) 09/26/2014 1159   HCT 35.6 (L) 09/26/2014 1159   PLT 176 09/26/2014 1159   MCV 84.8 09/26/2014 1159   MCH 28.3 09/26/2014 1159   MCHC 33.4 09/26/2014 1159   RDW 13.5 09/26/2014 1159   LYMPHSABS 1.9 08/14/2013 1005   MONOABS 0.4 08/14/2013 1005   EOSABS 0.3 08/14/2013 1005   BASOSABS 0.0 08/14/2013 1005    Chest imaging: CXR 10/09/2014 Mediastinum and hilar structures normal. Lungs are clear. Heart size normal. No pleural effusion or pneumothorax. No acute bony Abnormality.  PFT: No flowsheet data found.   Assessment & Plan:   Chronic cough - Plan: Pulmonary Function Test, fluticasone (FLONASE) 50 MCG/ACT nasal spray, ipratropium (ATROVENT) 0.03 % nasal spray, DG Chest 2 View  Discussion: Narissa Beaufort is a 65 year old woman, never smoker with GERD who presents to pulmonary clinic for chronic cough.   Her cough appears to be related to upper airway cough syndrome in relation to rhinitis and post-nasal drainage. Other differentials include cough variant asthma, bronchiectasis, tracheobronchomalacia but these seem less likely at this time.  She is to  start flonase nasal spray, 1 spray per nostril daily. She is to start ipratropium nasal spray, 2 sprays per nostril twice daily.   She is to resume loratidine daily or an equivalent allergy medicine.   Medications reviewed and there does not appear to be any offending agents that could lead to chronic cough.   We will update her chest imaging with a chest radiograph today. We will consider a high resolution CT Chest in the future.   We will check pulmonary function tests at the follow visit. She is to also use as needed albuterol for the cough and monitor for improvement.   Follow up in 2 months.   Melody Comas, MD [facility] Office: [phone]    Current Outpatient Medications:  .  diltiazem (TIAZAC) 240 MG 24 hr capsule, Take by mouth., Disp: , Rfl:  .  fenofibrate 160 MG tablet, Take 160 mg by mouth daily., Disp: , Rfl:  .  fluticasone (FLONASE) 50 MCG/ACT nasal spray, Place 1 spray into both nostrils daily., Disp: 16 g, Rfl: 2 .  glimepiride (AMARYL) 4 MG tablet, Take 4 mg by mouth 2 (two) times daily., Disp: , Rfl:  .  ibuprofen (ADVIL,MOTRIN) 800 MG tablet, Take 1 tablet (800 mg total) by mouth every 8 (eight) hours as needed., Disp: 30 tablet, Rfl: 0 .  ipratropium (ATROVENT) 0.03 % nasal spray, Place 2 sprays into both nostrils every 12 (twelve) hours., Disp: 30 mL, Rfl: 12 .  LANTUS SOLOSTAR 100 UNIT/ML Solostar Pen, Inject 35 Units into the skin daily. , Disp: , Rfl: 6 .  loratadine (CLARITIN) 10 MG tablet, Take 10 mg by mouth daily., Disp: , Rfl:  .  losartan (COZAAR) 100 MG tablet, Take 100 mg by mouth daily., Disp: , Rfl:  .  metFORMIN (GLUCOPHAGE) 500 MG tablet, Take 1,000 mg by mouth 2 (two) times daily. , Disp: , Rfl:  .  Multiple Vitamin (MULTIVITAMIN) tablet, Take 1 tablet by mouth daily., Disp: , Rfl:  .  diltiazem (CARDIZEM CD) 240 MG 24 hr capsule, Take 1 capsule (240 mg total) by mouth daily. Follow up with PCP, Disp: 90 capsule, Rfl:  2

## 2020-06-07 NOTE — Telephone Encounter (Signed)
I sent the albuterol HFA to her pharm  LMTCB for the pt

## 2020-06-07 NOTE — Patient Instructions (Addendum)
Start taking claritin daily   Start fluticasone nasal spray, 1 spray per nostril daily  Start ipratropium nasal spray, 2 sprays per nostril twice daily  Use albuterol inhaler as needed 1-2 sprays every 4-6 hours for cough  Use the above therapies until our next follow up visit in 2 months. We will schedule you for pulmonary function tests at that time.  We will check a chest radiograph today.

## 2020-06-07 NOTE — Telephone Encounter (Signed)
Informed patient inhaler has been called into pharmacy.

## 2020-06-10 ENCOUNTER — Telehealth: Payer: Self-pay | Admitting: Primary Care

## 2020-06-10 ENCOUNTER — Telehealth: Payer: Self-pay | Admitting: Pulmonary Disease

## 2020-06-10 MED ORDER — FLOVENT HFA 110 MCG/ACT IN AERO: 2.0000 | INHALATION_SPRAY | Freq: Two times a day (BID) | RESPIRATORY_TRACT | 2 refills | Status: DC

## 2020-06-10 NOTE — Telephone Encounter (Signed)
-----   Message from Martina Sinner, MD sent at 06/08/2020  9:57 PM EDT ----- Please let the patient know that her chest x-ray is showing inflammation around her airways concerning for acute or chronic bronchitis. She would benefit from flovent inhaler 2 puffs twice daily (rinse mouth out after each use). Please send in prescription for her.   Thanks, Dr. Francine Graven

## 2020-06-10 NOTE — Telephone Encounter (Signed)
Patient is returning phone call. Patient phone number is [phone].

## 2020-06-10 NOTE — Telephone Encounter (Signed)
Patient notified, answered all questions. She will start Flovent inhaler as prescribed

## 2020-06-10 NOTE — Telephone Encounter (Signed)
Spoke with the pt  She states that the flovent is not covered by her insurance  She is requesting alternative  I advised that she call her ins for list of more affordable inhalers  She agreed to do so and will call us back once done

## 2020-06-10 NOTE — Telephone Encounter (Signed)
Triage left message for patient to call back

## 2020-06-16 NOTE — Telephone Encounter (Signed)
Spoke with the pt  She states that she decided she wanted to try the flovent, so she paid for rx and is doing well with this  Closing encounter

## 2020-07-23 ENCOUNTER — Telehealth: Payer: Self-pay | Admitting: Pulmonary Disease

## 2020-07-23 NOTE — Telephone Encounter (Signed)
Called and spoke with Patient. Patient requested Flovent directions.  Flovent directions given.  Discussed maintenance inhalers and daily use.  Patient asked about inhaler alternatives. Advised Patient to contact insurance and request preferred inhaler alternatives to Flovent.  Understanding stated.  Nothing further at this time.

## 2020-08-06 ENCOUNTER — Other Ambulatory Visit (HOSPITAL_COMMUNITY)
Admission: RE | Admit: 2020-08-06 | Discharge: 2020-08-06 | Disposition: A | Discharge status: Home / Self Care | Payer: Medicare Other | Source: Ambulatory Visit | Attending: Pulmonary Disease | Admitting: Pulmonary Disease

## 2020-08-06 DIAGNOSIS — Z01812 Encounter for preprocedural laboratory examination: Secondary | ICD-10-CM | POA: Diagnosis present

## 2020-08-06 DIAGNOSIS — Z20822 Contact with and (suspected) exposure to covid-19: Secondary | ICD-10-CM | POA: Diagnosis not present

## 2020-08-06 LAB — SARS CORONAVIRUS 2 (TAT 6-24 HRS): SARS Coronavirus 2: NEGATIVE

## 2020-08-09 ENCOUNTER — Ambulatory Visit (INDEPENDENT_AMBULATORY_CARE_PROVIDER_SITE_OTHER): Payer: Medicare Other | Admitting: Pulmonary Disease

## 2020-08-09 ENCOUNTER — Other Ambulatory Visit: Payer: Self-pay

## 2020-08-09 ENCOUNTER — Encounter: Payer: Self-pay | Admitting: Pulmonary Disease

## 2020-08-09 VITALS — BP 132/64 | HR 83 | Temp 97.1°F | Ht 65.5 in | Wt 179.2 lb

## 2020-08-09 DIAGNOSIS — J310 Chronic rhinitis: Secondary | ICD-10-CM | POA: Diagnosis not present

## 2020-08-09 DIAGNOSIS — R053 Chronic cough: Secondary | ICD-10-CM | POA: Diagnosis not present

## 2020-08-09 DIAGNOSIS — J4540 Moderate persistent asthma, uncomplicated: Secondary | ICD-10-CM | POA: Diagnosis not present

## 2020-08-09 LAB — PULMONARY FUNCTION TEST
DL/VA % pred: 106 %
DL/VA: 4.39 ml/min/mmHg/L
DLCO cor % pred: 97 %
DLCO cor: 20.31 ml/min/mmHg
DLCO unc % pred: 97 %
DLCO unc: 20.31 ml/min/mmHg
FEF 25-75 Post: 1.48 L/sec
FEF 25-75 Pre: 1.24 L/sec
FEF2575-%Change-Post: 19 %
FEF2575-%Pred-Post: 66 %
FEF2575-%Pred-Pre: 55 %
FEV1-%Change-Post: 3 %
FEV1-%Pred-Post: 82 %
FEV1-%Pred-Pre: 79 %
FEV1-Post: 2.12 L
FEV1-Pre: 2.04 L
FEV1FVC-%Change-Post: 2 %
FEV1FVC-%Pred-Pre: 88 %
FEV6-%Change-Post: 0 %
FEV6-%Pred-Post: 93 %
FEV6-%Pred-Pre: 92 %
FEV6-Post: 3 L
FEV6-Pre: 2.98 L
FEV6FVC-%Change-Post: 0 %
FEV6FVC-%Pred-Post: 103 %
FEV6FVC-%Pred-Pre: 103 %
FVC-%Change-Post: 1 %
FVC-%Pred-Post: 90 %
FVC-%Pred-Pre: 89 %
FVC-Post: 3.02 L
FVC-Pre: 2.99 L
Post FEV1/FVC ratio: 70 %
Post FEV6/FVC ratio: 99 %
Pre FEV1/FVC ratio: 68 %
Pre FEV6/FVC Ratio: 100 %
RV % pred: 110 %
RV: 2.38 L
TLC % pred: 103 %
TLC: 5.48 L

## 2020-08-09 MED ORDER — BUDESONIDE-FORMOTEROL FUMARATE 160-4.5 MCG/ACT IN AERO: 2.0000 | INHALATION_SPRAY | Freq: Two times a day (BID) | RESPIRATORY_TRACT | 12 refills | Status: DC

## 2020-08-09 NOTE — Progress Notes (Signed)
PFT done today. 

## 2020-08-09 NOTE — Patient Instructions (Addendum)
Continue Flovent inhaler 2 puffs twice daily until done. Then start symbicort 160-4.5mcg 2 puffs twice daily. Please let us know if the symbicort inhaler is not covered by your insurance.  Use albuterol 1-2 puffs every 4-6 hours as needed  Continue to use fluticasone nasal spray, 1 spray per nostril daily  The ipratropium nasal spray can be weaned down to 1 spray twice daily and then 1 spray at night time. Monitor your nasal congestion and drainage. If not worsening then can stop this medication.

## 2020-08-09 NOTE — Progress Notes (Signed)
Synopsis: Referred in March 2022 for cough, self referred  Subjective:   PATIENT ID: Kimberly Duke GENDER: female DOB: Oct 22, 1955, MRN: [id number]  Using flovent 2 puffs twice daily Using the nasal sprays consistently  She had fallen off her regimen for a little while. Couldn't tell if her cough has worsened.    HPI  Chief Complaint  Patient presents with  . Follow-up    2 mo f/u after PFT. States her breathing has been ok since last visit. States her cough has not changed, still non-productive. Still using Flovent.    Kimberly Duke is a 65 year old woman, never smoker with GERD and rhinitis/post-nasal drip who returns to pulmonary clinic for chronic cough.   She was started on fluticasone nasal spray, 1 spray per nostril and ipratropium nasal spray 2 sprays per nostril twice daily with improvement in her nasal congestion and drainage. She was also started on flovent after the last visit based on her chest radiograph showed peribronchial cuffing.  She reports significant improvement in her cough since last visit but continues to have cough none the less with sputum production. She continues to experience some post-nasal drainage.   PFTs today show mild obstructive defect.    Past Medical History:  Diagnosis Date  . Arthritis   . GERD (gastroesophageal reflux disease)    without acid reflux  . History of endometriosis   . History of hypothyroidism   . History of kidney stones   . Hyperlipidemia   . Hypertension   . Osteoporosis   . PONV (postoperative nausea and vomiting)   . Renal calculus, left   . Thyroid nodule    monitored by dr balan-  negartive bx's  . Type 2 diabetes mellitus (HCC)   . Upper airway cough syndrome    pulmologist-   dr wert  . Wears glasses      Family History  Problem Relation Age of Onset  . Diabetes Mother   . Hyperlipidemia Mother   . COPD Father        smoked  . Diabetes Sister   . Breast cancer Paternal Grandmother    . Cancer Paternal Grandmother        breast cancer (older onset)  . Lung cancer Paternal Grandfather        smoked  . Cancer Paternal Grandfather        lung (smoker)  . Diabetes Brother   . Hypertension Brother   . Cancer Maternal Grandmother 90       breast cancer  . Cancer Maternal Grandfather        lung cancer     Social History   Socioeconomic History  . Marital status: Married    Spouse name: Not on file  . Number of children: 1  . Years of education: Not on file  . Highest education level: Not on file  Occupational History  . Not on file  Tobacco Use  . Smoking status: Never Smoker  . Smokeless tobacco: Never Used  Substance and Sexual Activity  . Alcohol use: No  . Drug use: No  . Sexual activity: Not on file  Other Topics Concern  . Not on file  Social History Narrative  . Not on file   Social Determinants of Health   Financial Resource Strain: Not on file  Food Insecurity: Not on file  Transportation Needs: Not on file  Physical Activity: Not on file  Stress: Not on file  Social Connections: Not  on file  Intimate Partner Violence: Not on file     Allergies  Allergen Reactions  . Amoxicillin Hives  . Darvon Nausea And Vomiting     Outpatient Medications Prior to Visit  Medication Sig Dispense Refill  . albuterol (VENTOLIN HFA) 108 (90 Base) MCG/ACT inhaler Inhale 1-2 puffs into the lungs every 6 (six) hours as needed for wheezing or shortness of breath. 8 g 2  . fenofibrate 160 MG tablet Take 160 mg by mouth daily.    . fluticasone (FLONASE) 50 MCG/ACT nasal spray Place 1 spray into both nostrils daily. 16 g 2  . fluticasone (FLOVENT HFA) 110 MCG/ACT inhaler Inhale 2 puffs into the lungs 2 (two) times daily. Rinse mouth after use 1 each 2  . glimepiride (AMARYL) 4 MG tablet Take 4 mg by mouth 2 (two) times daily.    Marland Kitchen ipratropium (ATROVENT) 0.03 % nasal spray Place 2 sprays into both nostrils every 12 (twelve) hours. 30 mL 12  . LANTUS SOLOSTAR  100 UNIT/ML Solostar Pen Inject 50-60 Units into the skin daily.  6  . loratadine (CLARITIN) 10 MG tablet Take 10 mg by mouth daily.    Marland Kitchen losartan (COZAAR) 100 MG tablet Take 100 mg by mouth daily.    . metFORMIN (GLUCOPHAGE) 500 MG tablet Take 1,000 mg by mouth 2 (two) times daily.     . Multiple Vitamin (MULTIVITAMIN) tablet Take 1 tablet by mouth daily.    Marland Kitchen diltiazem (CARDIZEM CD) 240 MG 24 hr capsule Take 1 capsule (240 mg total) by mouth daily. Follow up with PCP 90 capsule 2  . diltiazem (TIAZAC) 240 MG 24 hr capsule Take by mouth.    Marland Kitchen ibuprofen (ADVIL,MOTRIN) 800 MG tablet Take 1 tablet (800 mg total) by mouth every 8 (eight) hours as needed. 30 tablet 0   No facility-administered medications prior to visit.    Review of Systems  Constitutional: Negative for chills, fever, malaise/fatigue and weight loss.  HENT: Positive for congestion (post-nasal drainage). Negative for sinus pain and sore throat.   Eyes: Negative.   Respiratory: Positive for cough. Negative for hemoptysis, sputum production, shortness of breath and wheezing.   Cardiovascular: Negative for chest pain, palpitations, orthopnea, claudication and leg swelling.  Gastrointestinal: Negative for abdominal pain, heartburn, nausea and vomiting.  Genitourinary: Negative.   Musculoskeletal: Negative for joint pain and myalgias.  Skin: Negative for rash.  Neurological: Negative for weakness.  Endo/Heme/Allergies: Negative.   Psychiatric/Behavioral: Negative.       Objective:   Vitals:   08/09/20 1105  BP: 132/64  Pulse: 83  Temp: (!) 97.1 F (36.2 C)  TempSrc: Temporal  SpO2: 96%  Weight: 179 lb 3.2 oz (81.3 kg)  Height: 5' 5.5" (1.664 m)     Physical Exam Constitutional:      General: She is not in acute distress.    Appearance: Normal appearance. She is not ill-appearing.  HENT:     Head: Normocephalic and atraumatic.     Nose: Nose normal.     Mouth/Throat:     Mouth: Mucous membranes are moist.      Pharynx: Posterior oropharyngeal erythema: mild.  Eyes:     General: No scleral icterus.    Conjunctiva/sclera: Conjunctivae normal.     Pupils: Pupils are equal, round, and reactive to light.  Cardiovascular:     Rate and Rhythm: Normal rate and regular rhythm.     Pulses: Normal pulses.     Heart sounds: Normal heart sounds. No  murmur heard.   Pulmonary:     Effort: Pulmonary effort is normal.     Breath sounds: Normal breath sounds. No wheezing, rhonchi or rales.  Abdominal:     General: Bowel sounds are normal.     Palpations: Abdomen is soft.  Musculoskeletal:     Right lower leg: No edema.     Left lower leg: No edema.  Lymphadenopathy:     Cervical: No cervical adenopathy.  Skin:    General: Skin is warm and dry.  Neurological:     General: No focal deficit present.     Mental Status: She is alert.  Psychiatric:        Mood and Affect: Mood normal.        Behavior: Behavior normal.        Thought Content: Thought content normal.        Judgment: Judgment normal.     CBC    Component Value Date/Time   WBC 10.0 09/26/2014 1159   RBC 4.20 09/26/2014 1159   HGB 11.9 (L) 09/26/2014 1159   HCT 35.6 (L) 09/26/2014 1159   PLT 176 09/26/2014 1159   MCV 84.8 09/26/2014 1159   MCH 28.3 09/26/2014 1159   MCHC 33.4 09/26/2014 1159   RDW 13.5 09/26/2014 1159   LYMPHSABS 1.9 08/14/2013 1005   MONOABS 0.4 08/14/2013 1005   EOSABS 0.3 08/14/2013 1005   BASOSABS 0.0 08/14/2013 1005    Chest imaging: CXR 06/07/20 Peribronchial cuffing present diffusely.   CXR 10/09/2014 Mediastinum and hilar structures normal. Lungs are clear. Heart size normal. No pleural effusion or pneumothorax. No acute bony Abnormality.  PFT: PFT Results Latest Ref Rng & Units 08/09/2020  FVC-Pre L 2.99  FVC-Predicted Pre % 89  FVC-Post L 3.02  FVC-Predicted Post % 90  Pre FEV1/FVC % % 68  Post FEV1/FCV % % 70  FEV1-Pre L 2.04  FEV1-Predicted Pre % 79  FEV1-Post L 2.12  DLCO  uncorrected ml/min/mmHg 20.31  DLCO UNC% % 97  DLCO corrected ml/min/mmHg 20.31  DLCO COR %Predicted % 97  DLVA Predicted % 106  TLC L 5.48  TLC % Predicted % 103  RV % Predicted % 110  PFT 2022: Mild obstructive defect   Assessment & Plan:   Moderate persistent asthma without complication - Plan: budesonide-formoterol (SYMBICORT) 160-4.5 MCG/ACT inhaler  Chronic rhinitis  Discussion: Kimberly Duke is a 65 year old woman, never smoker with GERD and rhinitis/post-nasal drip who returns to pulmonary clinic for chronic cough.    Her cough appears to be related to upper airway cough syndrome in relation to rhinitis and post-nasal drainage along with moderate persistent asthma based on her PFTs and chest radiograph.  We will transition her to symbicort 160-4.57mcg 2 puffs twice daily in place of the flovent inhaler. She can continue as needed albuterol.  She is to continue flonase nasal spray, 1 spray per nostril daily. We will try to wean her off the ipratropium nasal spray if possible based on her symptoms.   Continue loratidine daily or an equivalent allergy medicine.   Follow up in 6 months.   Melody Comas, MD [facility] Office: [phone]    Current Outpatient Medications:  .  albuterol (VENTOLIN HFA) 108 (90 Base) MCG/ACT inhaler, Inhale 1-2 puffs into the lungs every 6 (six) hours as needed for wheezing or shortness of breath., Disp: 8 g, Rfl: 2 .  budesonide-formoterol (SYMBICORT) 160-4.5 MCG/ACT inhaler, Inhale 2 puffs into the lungs 2 (two)  times daily., Disp: 1 each, Rfl: 12 .  fenofibrate 160 MG tablet, Take 160 mg by mouth daily., Disp: , Rfl:  .  fluticasone (FLONASE) 50 MCG/ACT nasal spray, Place 1 spray into both nostrils daily., Disp: 16 g, Rfl: 2 .  fluticasone (FLOVENT HFA) 110 MCG/ACT inhaler, Inhale 2 puffs into the lungs 2 (two) times daily. Rinse mouth after use, Disp: 1 each, Rfl: 2 .  glimepiride (AMARYL) 4 MG tablet, Take 4 mg  by mouth 2 (two) times daily., Disp: , Rfl:  .  ipratropium (ATROVENT) 0.03 % nasal spray, Place 2 sprays into both nostrils every 12 (twelve) hours., Disp: 30 mL, Rfl: 12 .  LANTUS SOLOSTAR 100 UNIT/ML Solostar Pen, Inject 50-60 Units into the skin daily., Disp: , Rfl: 6 .  loratadine (CLARITIN) 10 MG tablet, Take 10 mg by mouth daily., Disp: , Rfl:  .  losartan (COZAAR) 100 MG tablet, Take 100 mg by mouth daily., Disp: , Rfl:  .  metFORMIN (GLUCOPHAGE) 500 MG tablet, Take 1,000 mg by mouth 2 (two) times daily. , Disp: , Rfl:  .  Multiple Vitamin (MULTIVITAMIN) tablet, Take 1 tablet by mouth daily., Disp: , Rfl:  .  diltiazem (CARDIZEM CD) 240 MG 24 hr capsule, Take 1 capsule (240 mg total) by mouth daily. Follow up with PCP, Disp: 90 capsule, Rfl: 2 .  diltiazem (TIAZAC) 240 MG 24 hr capsule, Take by mouth., Disp: , Rfl:

## 2020-10-19 ENCOUNTER — Telehealth: Payer: Self-pay | Admitting: Pulmonary Disease

## 2020-10-19 NOTE — Telephone Encounter (Signed)
Spoke with pt. She was prescribed Singulair by her PCP for asthma symptoms. Pt read that it could cause mental side effects and os hesitant to take it. She wanted to check with Dr Francine Graven to get his recommendations as far as taking it. Please advise.

## 2020-10-19 NOTE — Telephone Encounter (Signed)
Singulair is an overall safe medication that is usually well tolerated. I think it is a great addition to help treat her asthma. I would also recommend that she take it.   Thanks, Cletis Athens

## 2020-10-19 NOTE — Telephone Encounter (Signed)
Spoke with pt and advised of Dr Lanora Manis recommendations. Pt verbalized understanding. Nothing further needed at this time.

## 2021-04-28 DIAGNOSIS — K2270 Barrett's esophagus without dysplasia: Secondary | ICD-10-CM

## 2021-04-28 HISTORY — DX: Barrett's esophagus without dysplasia: K22.70

## 2021-04-28 HISTORY — PX: COLONOSCOPY WITH ESOPHAGOGASTRODUODENOSCOPY (EGD): SHX5779

## 2021-08-02 ENCOUNTER — Encounter: Payer: Self-pay | Admitting: Pulmonary Disease

## 2021-08-02 ENCOUNTER — Ambulatory Visit (INDEPENDENT_AMBULATORY_CARE_PROVIDER_SITE_OTHER): Payer: Medicare Other | Admitting: Pulmonary Disease

## 2021-08-02 VITALS — BP 122/68 | HR 84 | Ht 65.5 in | Wt 170.2 lb

## 2021-08-02 DIAGNOSIS — J4540 Moderate persistent asthma, uncomplicated: Secondary | ICD-10-CM | POA: Diagnosis not present

## 2021-08-02 DIAGNOSIS — R053 Chronic cough: Secondary | ICD-10-CM

## 2021-08-02 NOTE — Progress Notes (Signed)
Synopsis: Referred in March 2022 for cough, self referred  Subjective:   PATIENT ID: Kimberly Duke GENDER: female DOB: 02-28-1956, MRN: [id number]  HPI  Chief Complaint  Patient presents with   Follow-up    16yr f/u for cough. Cough has improved but still having flare ups. Cough has been mainly non-productive.    Kimberly Duke is a 66 year old woman, never smoker with GERD and rhinitis/post-nasal drip who returns to pulmonary clinic for moderate persistent asthma.   She has not been using Symbicort 160-4.9mcg 2 puffs twice daily. She reports not noticing much of an improvement in her cough after trying it for 1-2 months.   Still has strong cough 1-2 times per day. Strong perfumes trigger her cough.   She had EGD 04/28/21 by [facility] noted to have short segment Barrett's esophagus. She has tried wedge pillow in the past. She is taking 20mg  prilosec BID.   Has improved sinus congestion as allergies are getting better and she recently had cold. She has ipratropium nasal spray that she uses as needed. Using flonase PRN.    OV 08/09/20 She was started on fluticasone nasal spray, 1 spray per nostril and ipratropium nasal spray 2 sprays per nostril twice daily with improvement in her nasal congestion and drainage. She was also started on flovent 08/11/20 after the last visit based on her chest radiograph showed peribronchial cuffing.  She reports significant improvement in her cough since last visit but continues to have cough none the less with sputum production. She continues to experience some post-nasal drainage.   PFTs today show mild obstructive defect.    Past Medical History:  Diagnosis Date   Arthritis    GERD (gastroesophageal reflux disease)    without acid reflux   History of endometriosis    History of hypothyroidism    History of kidney stones    Hyperlipidemia    Hypertension    Osteoporosis    PONV (postoperative nausea and vomiting)    Renal calculus, left     Thyroid nodule    monitored by dr -  negartive bx's   Type 2 diabetes mellitus (HCC)    Upper airway cough syndrome    pulmologist-   dr wert   Wears glasses      Family History  Problem Relation Age of Onset   Diabetes Mother    Hyperlipidemia Mother    COPD Father        smoked   Diabetes Sister    Breast cancer Paternal Grandmother    Cancer Paternal Grandmother        breast cancer (older onset)   Lung cancer Paternal Grandfather        smoked   Cancer Paternal Grandfather        lung (smoker)   Diabetes Brother    Hypertension Brother    Cancer Maternal Grandmother 4       breast cancer   Cancer Maternal Grandfather        lung cancer     Social History   Socioeconomic History   Marital status: Married    Spouse name: Not on file   Number of children: 1   Years of education: Not on file   Highest education level: Not on file  Occupational History   Not on file  Tobacco Use   Smoking status: Never   Smokeless tobacco: Never  Substance and Sexual Activity   Alcohol use: No   Drug use: No  Sexual activity: Not on file  Other Topics Concern   Not on file  Social History Narrative   Not on file   Social Determinants of Health   Financial Resource Strain: Not on file  Food Insecurity: Not on file  Transportation Needs: Not on file  Physical Activity: Not on file  Stress: Not on file  Social Connections: Not on file  Intimate Partner Violence: Not on file     Allergies  Allergen Reactions   Amoxicillin Hives   Darvon Nausea And Vomiting     Outpatient Medications Prior to Visit  Medication Sig Dispense Refill   albuterol (VENTOLIN HFA) 108 (90 Base) MCG/ACT inhaler Inhale 1-2 puffs into the lungs every 6 (six) hours as needed for wheezing or shortness of breath. 8 g 2   diltiazem (TIAZAC) 240 MG 24 hr capsule Take by mouth.     fenofibrate 160 MG tablet Take 160 mg by mouth daily.     glimepiride (AMARYL) 4 MG tablet Take 4 mg by mouth 2  (two) times daily.     insulin degludec (TRESIBA FLEXTOUCH) 100 UNIT/ML FlexTouch Pen 25 Units at bedtime.     ipratropium (ATROVENT) 0.06 % nasal spray 2 sprays in each nostril     loratadine (CLARITIN) 10 MG tablet Take 10 mg by mouth daily.     losartan (COZAAR) 100 MG tablet Take 100 mg by mouth daily.     montelukast (SINGULAIR) 10 MG tablet Take 1 tablet by mouth daily.     Multiple Vitamin (MULTIVITAMIN) tablet Take 1 tablet by mouth daily.     SitaGLIPtin-MetFORMIN HCl (JANUMET XR) 50-1000 MG TB24 1 tablet     budesonide-formoterol (SYMBICORT) 160-4.5 MCG/ACT inhaler Inhale 2 puffs into the lungs 2 (two) times daily. 1 each 12   fluticasone (FLONASE) 50 MCG/ACT nasal spray Place 1 spray into both nostrils daily. 16 g 2   fluticasone (FLOVENT HFA) 110 MCG/ACT inhaler Inhale 2 puffs into the lungs 2 (two) times daily. Rinse mouth after use 1 each 2   diltiazem (CARDIZEM CD) 240 MG 24 hr capsule Take 1 capsule (240 mg total) by mouth daily. Follow up with PCP 90 capsule 2   ipratropium (ATROVENT) 0.03 % nasal spray Place 2 sprays into both nostrils every 12 (twelve) hours. 30 mL 12   LANTUS SOLOSTAR 100 UNIT/ML Solostar Pen Inject 50-60 Units into the skin daily.  6   metFORMIN (GLUCOPHAGE) 500 MG tablet Take 1,000 mg by mouth 2 (two) times daily.      No facility-administered medications prior to visit.    Review of Systems  Constitutional:  Negative for chills, fever, malaise/fatigue and weight loss.  HENT:  Positive for congestion (post-nasal drainage). Negative for sinus pain and sore throat.   Eyes: Negative.   Respiratory:  Positive for cough. Negative for hemoptysis, sputum production, shortness of breath and wheezing.   Cardiovascular:  Negative for chest pain, palpitations, orthopnea, claudication and leg swelling.  Gastrointestinal:  Negative for abdominal pain, heartburn, nausea and vomiting.  Genitourinary: Negative.   Musculoskeletal:  Negative for joint pain and  myalgias.  Skin:  Negative for rash.  Neurological:  Negative for weakness.  Endo/Heme/Allergies: Negative.   Psychiatric/Behavioral: Negative.       Objective:   Vitals:   08/02/21 1417  BP: 122/68  Pulse: 84  SpO2: 98%  Weight: 170 lb 3.2 oz (77.2 kg)  Height: 5' 5.5" (1.664 m)      Physical Exam Constitutional:  General: She is not in acute distress.    Appearance: Normal appearance. She is not ill-appearing.  HENT:     Head: Normocephalic and atraumatic.  Eyes:     General: No scleral icterus.    Conjunctiva/sclera: Conjunctivae normal.  Cardiovascular:     Rate and Rhythm: Normal rate and regular rhythm.     Pulses: Normal pulses.     Heart sounds: Normal heart sounds. No murmur heard. Pulmonary:     Effort: Pulmonary effort is normal.     Breath sounds: Normal breath sounds. No wheezing, rhonchi or rales.  Musculoskeletal:     Right lower leg: No edema.     Left lower leg: No edema.  Skin:    General: Skin is warm and dry.  Neurological:     General: No focal deficit present.     Mental Status: She is alert.  Psychiatric:        Mood and Affect: Mood normal.        Behavior: Behavior normal.        Thought Content: Thought content normal.        Judgment: Judgment normal.    CBC    Component Value Date/Time   WBC 10.0 09/26/2014 1159   RBC 4.20 09/26/2014 1159   HGB 11.9 (L) 09/26/2014 1159   HCT 35.6 (L) 09/26/2014 1159   PLT 176 09/26/2014 1159   MCV 84.8 09/26/2014 1159   MCH 28.3 09/26/2014 1159   MCHC 33.4 09/26/2014 1159   RDW 13.5 09/26/2014 1159   LYMPHSABS 1.9 08/14/2013 1005   MONOABS 0.4 08/14/2013 1005   EOSABS 0.3 08/14/2013 1005   BASOSABS 0.0 08/14/2013 1005    Chest imaging: CXR 06/07/20 Peribronchial cuffing present diffusely.   CXR 10/09/2014 Mediastinum and hilar structures normal. Lungs are clear. Heart size normal. No pleural effusion or pneumothorax. No acute bony Abnormality.  PFT:    Latest Ref Rng &  Units 08/09/2020    9:44 AM  PFT Results  FVC-Pre L 2.99    FVC-Predicted Pre % 89    FVC-Post L 3.02    FVC-Predicted Post % 90    Pre FEV1/FVC % % 68    Post FEV1/FCV % % 70    FEV1-Pre L 2.04    FEV1-Predicted Pre % 79    FEV1-Post L 2.12    DLCO uncorrected ml/min/mmHg 20.31    DLCO UNC% % 97    DLCO corrected ml/min/mmHg 20.31    DLCO COR %Predicted % 97    DLVA Predicted % 106    TLC L 5.48    TLC % Predicted % 103    RV % Predicted % 110    PFT 2022: Mild obstructive defect   Assessment & Plan:   No diagnosis found.  Discussion: Kimberly Duke is a 66 year old woman, never smoker with GERD and rhinitis/post-nasal drip who returns to pulmonary clinic for chronic cough and asthma.    Her cough appears to be related to upper airway cough syndrome in relation to rhinitis and post-nasal drainage along with moderate persistent asthma based on her PFTs and chest radiograph.  Her cough seems more related to her reflux symptoms at this time. Recommend eating at least 2 hours prior to bedtime and sleeping with the head of the bed elevated.   She is to continue symbicort 160-4.100mcg 2 puffs twice daily as needed.  She is to continue flonase nasal spray and ipratropium nasal spray as needed.  Continue loratidine daily  or an equivalent allergy medicine.   Follow up as needed.  Melody Comas, MD [facility] Office: [phone]    Current Outpatient Medications:    albuterol (VENTOLIN HFA) 108 (90 Base) MCG/ACT inhaler, Inhale 1-2 puffs into the lungs every 6 (six) hours as needed for wheezing or shortness of breath., Disp: 8 g, Rfl: 2   diltiazem (TIAZAC) 240 MG 24 hr capsule, Take by mouth., Disp: , Rfl:    fenofibrate 160 MG tablet, Take 160 mg by mouth daily., Disp: , Rfl:    glimepiride (AMARYL) 4 MG tablet, Take 4 mg by mouth 2 (two) times daily., Disp: , Rfl:    insulin degludec (TRESIBA FLEXTOUCH) 100 UNIT/ML FlexTouch Pen, 25 Units at  bedtime., Disp: , Rfl:    ipratropium (ATROVENT) 0.06 % nasal spray, 2 sprays in each nostril, Disp: , Rfl:    loratadine (CLARITIN) 10 MG tablet, Take 10 mg by mouth daily., Disp: , Rfl:    losartan (COZAAR) 100 MG tablet, Take 100 mg by mouth daily., Disp: , Rfl:    montelukast (SINGULAIR) 10 MG tablet, Take 1 tablet by mouth daily., Disp: , Rfl:    Multiple Vitamin (MULTIVITAMIN) tablet, Take 1 tablet by mouth daily., Disp: , Rfl:    SitaGLIPtin-MetFORMIN HCl (JANUMET XR) 50-1000 MG TB24, 1 tablet, Disp: , Rfl:

## 2021-08-02 NOTE — Patient Instructions (Signed)
Your cough seems related to GERD or reflux disease.  ? ?Recommend eating at least 2 hours prior to bedtime. ? ?Continue prilosec as directed by your GI doctor.  ? ?Recommend sleeping with the head of the bed elevated to reduce nocturnal reflux disease ? ?Recommend resting your throat as much as possible with cough suppression and trying not to cough or talk.  ? ?Safe to stop using inhalers at this time.  ? ?Continue nasal sprays as needed.  ? ? ?

## 2021-08-07 ENCOUNTER — Encounter: Payer: Self-pay | Admitting: Pulmonary Disease

## 2021-08-16 IMAGING — DX DG CHEST 2V
2 series · 2 of 2 positions shown · non-contrast
Comparison: Chest x-ray 10/09/2014.

CLINICAL DATA: 64-year-old female with history of chronic cough.

EXAM:
CHEST - 2 VIEW

[chest pa]
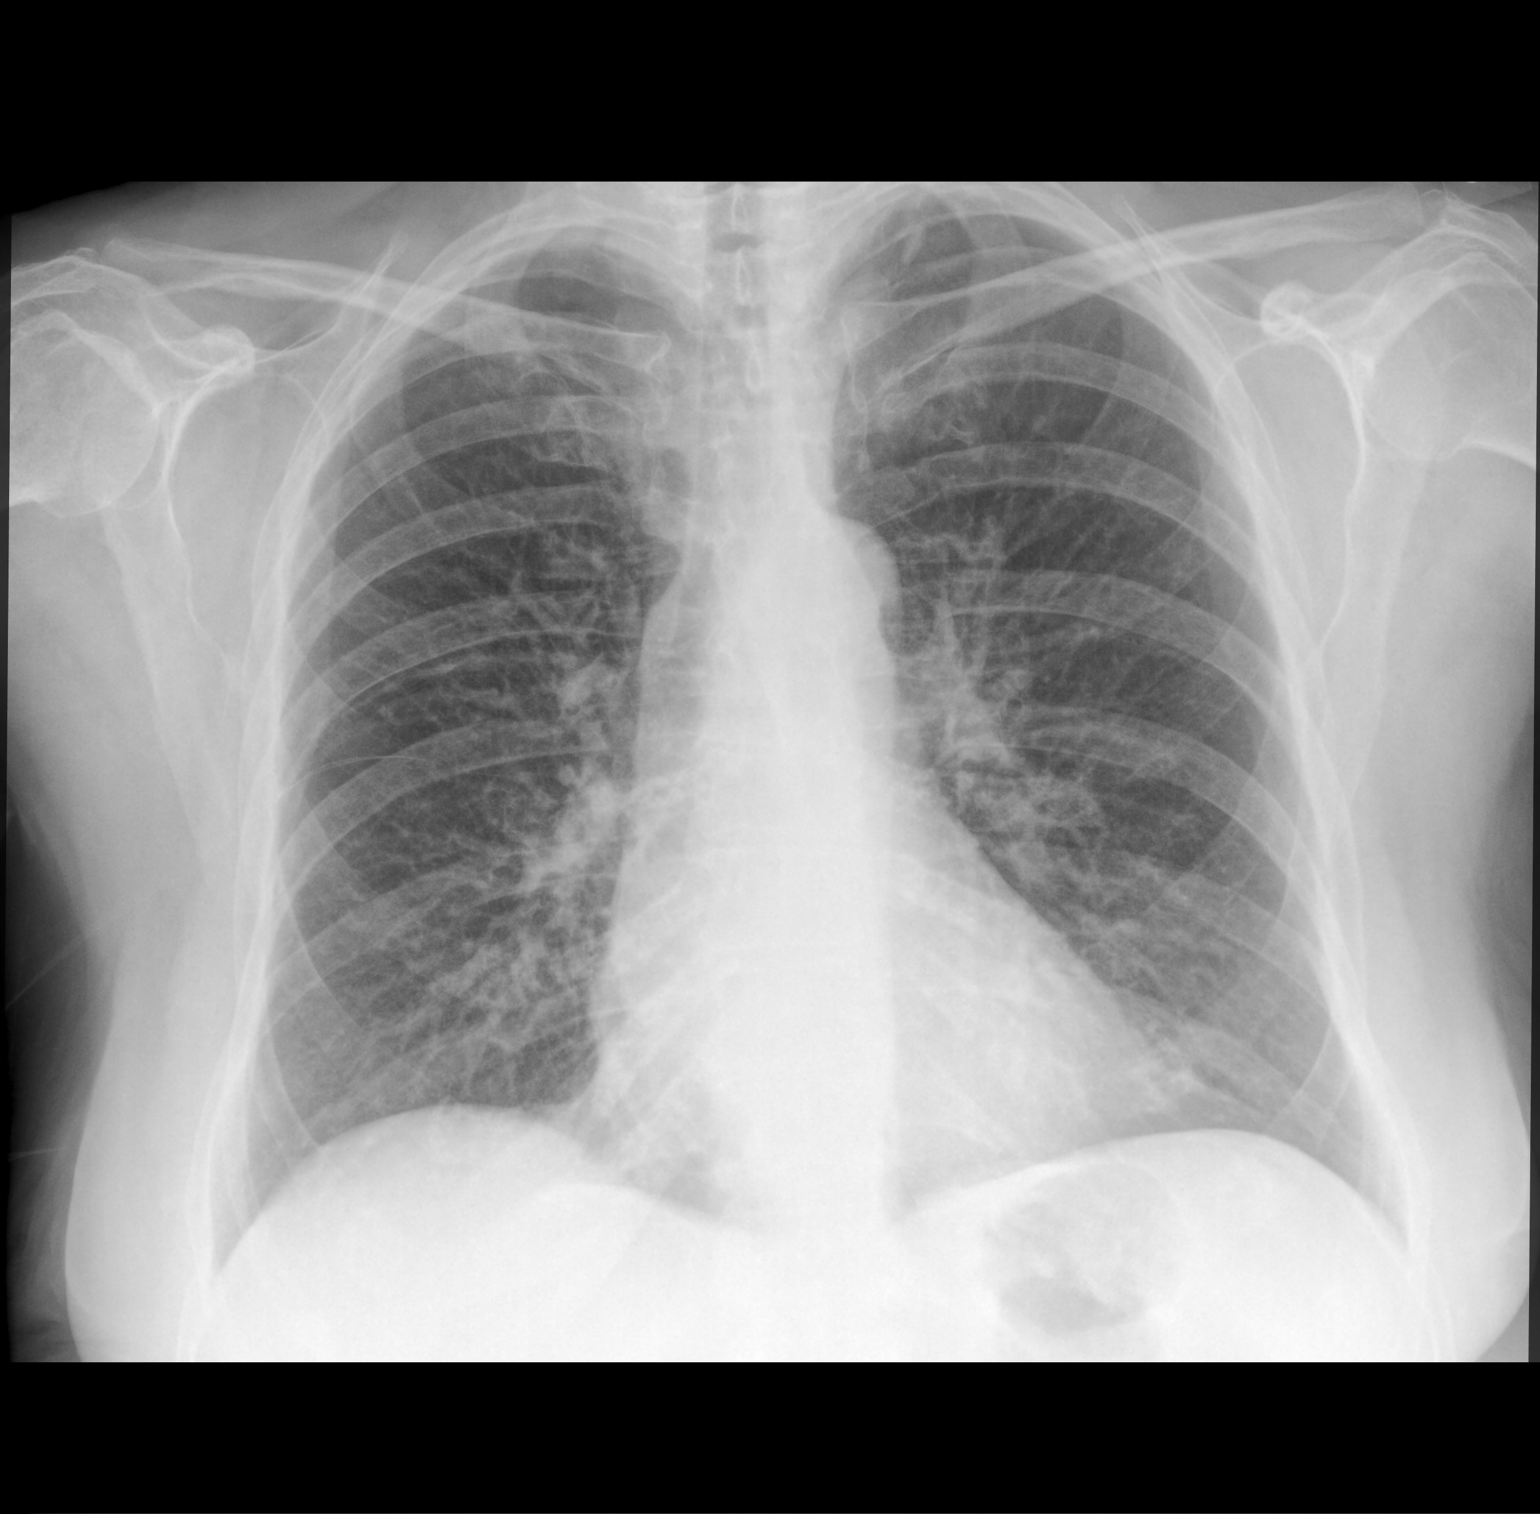

[chest lat]
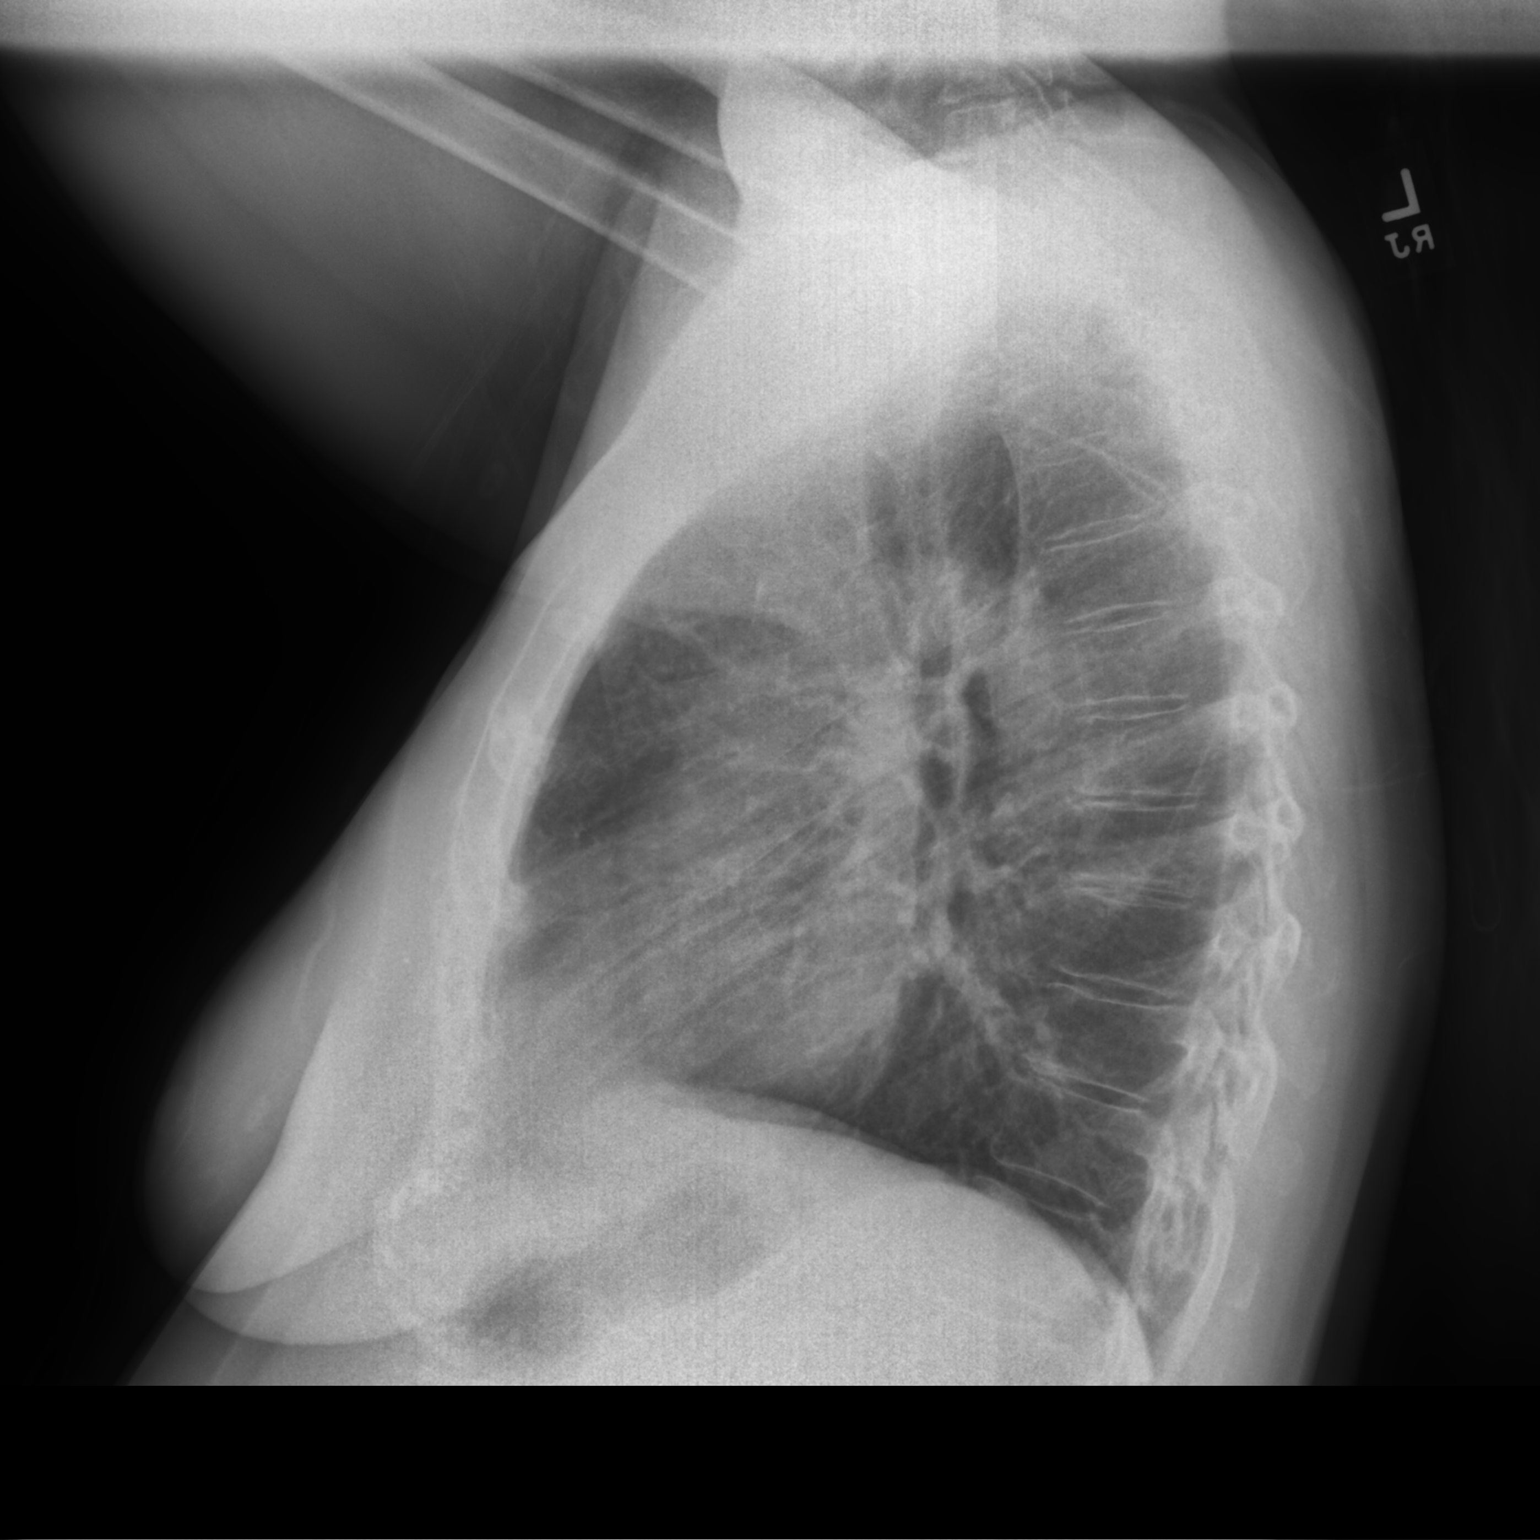

[2 of 2 positions shown; findings below may reference images not displayed]

FINDINGS: Lung volumes are normal. Diffuse peribronchial cuffing. No
consolidative airspace disease. No pleural effusions. No
pneumothorax. No pulmonary nodule or mass noted. Pulmonary
vasculature and the cardiomediastinal silhouette are within normal
limits.
IMPRESSION: 1. Diffuse peribronchial cuffing which may suggest an acute or
chronic bronchitis.

## 2022-06-28 ENCOUNTER — Other Ambulatory Visit (HOSPITAL_BASED_OUTPATIENT_CLINIC_OR_DEPARTMENT_OTHER): Payer: Self-pay | Admitting: Endocrinology

## 2022-06-28 DIAGNOSIS — E7800 Pure hypercholesterolemia, unspecified: Secondary | ICD-10-CM

## 2022-07-19 ENCOUNTER — Encounter (HOSPITAL_COMMUNITY): Payer: Self-pay

## 2022-07-19 ENCOUNTER — Ambulatory Visit (HOSPITAL_COMMUNITY)
Admission: RE | Admit: 2022-07-19 | Discharge: 2022-07-19 | Disposition: A | Discharge status: Home / Self Care | Payer: Medicare Other | Source: Ambulatory Visit | Attending: Endocrinology | Admitting: Endocrinology

## 2022-07-19 DIAGNOSIS — E7800 Pure hypercholesterolemia, unspecified: Secondary | ICD-10-CM | POA: Insufficient documentation

## 2022-07-19 DIAGNOSIS — I2510 Atherosclerotic heart disease of native coronary artery without angina pectoris: Secondary | ICD-10-CM

## 2022-07-19 HISTORY — DX: Atherosclerotic heart disease of native coronary artery without angina pectoris: I25.10

## 2022-07-20 ENCOUNTER — Other Ambulatory Visit: Payer: Self-pay | Admitting: Urology

## 2022-08-09 ENCOUNTER — Encounter (HOSPITAL_BASED_OUTPATIENT_CLINIC_OR_DEPARTMENT_OTHER): Payer: Self-pay | Admitting: Urology

## 2022-08-10 ENCOUNTER — Encounter: Payer: Self-pay | Admitting: Cardiology

## 2022-08-10 ENCOUNTER — Encounter (HOSPITAL_BASED_OUTPATIENT_CLINIC_OR_DEPARTMENT_OTHER): Payer: Self-pay | Admitting: Urology

## 2022-08-10 ENCOUNTER — Ambulatory Visit: Payer: Medicare Other | Admitting: Cardiology

## 2022-08-10 VITALS — BP 141/68 | HR 76 | Resp 16 | Ht 65.0 in | Wt 169.0 lb

## 2022-08-10 DIAGNOSIS — E782 Mixed hyperlipidemia: Secondary | ICD-10-CM

## 2022-08-10 DIAGNOSIS — I25118 Atherosclerotic heart disease of native coronary artery with other forms of angina pectoris: Secondary | ICD-10-CM | POA: Insufficient documentation

## 2022-08-10 DIAGNOSIS — I2510 Atherosclerotic heart disease of native coronary artery without angina pectoris: Secondary | ICD-10-CM

## 2022-08-10 DIAGNOSIS — I700 Atherosclerosis of aorta: Secondary | ICD-10-CM | POA: Insufficient documentation

## 2022-08-10 DIAGNOSIS — R931 Abnormal findings on diagnostic imaging of heart and coronary circulation: Secondary | ICD-10-CM | POA: Insufficient documentation

## 2022-08-10 DIAGNOSIS — I10 Essential (primary) hypertension: Secondary | ICD-10-CM

## 2022-08-10 DIAGNOSIS — R6889 Other general symptoms and signs: Secondary | ICD-10-CM | POA: Insufficient documentation

## 2022-08-10 MED ORDER — ROSUVASTATIN CALCIUM 20 MG PO TABS: 20.0000 mg | ORAL_TABLET | Freq: Every day | ORAL | 3 refills | Status: DC

## 2022-08-10 NOTE — Progress Notes (Signed)
Follow up visit  Subjective:   Kimberly Duke, female    DOB: [birth year], 67 y.o.   MRN: [id number]   HPI  Chief Complaint  Patient presents with   Agatston coronary artery calcium score between 100 and 400   Mixed hyperlipidemia   Follow-up    67 y.o.Caucasian female with hypertension, type 2 DM, elevated coronary calcium score  Patient was recently found to have elevated coronary calcium. She denies any chest pain, but has decreased exercise tolerance in general. On a separate note, she has an upcoming procedure for kidney stones on June 7, by Dr Sebastian Ache.    Current Outpatient Medications:    diltiazem (TIAZAC) 300 MG 24 hr capsule, Take 300 mg by mouth daily., Disp: , Rfl:    fenofibrate 160 MG tablet, Take 160 mg by mouth daily., Disp: , Rfl:    glimepiride (AMARYL) 4 MG tablet, Take 4 mg by mouth 2 (two) times daily., Disp: , Rfl:    insulin degludec (TRESIBA FLEXTOUCH) 100 UNIT/ML FlexTouch Pen, 25 Units at bedtime., Disp: , Rfl:    loratadine (CLARITIN) 10 MG tablet, Take 10 mg by mouth daily., Disp: , Rfl:    Multiple Vitamin (MULTIVITAMIN) tablet, Take 1 tablet by mouth daily., Disp: , Rfl:    omeprazole (PRILOSEC) 40 MG capsule, Take 40 mg by mouth 2 (two) times daily before a meal., Disp: , Rfl:    SitaGLIPtin-MetFORMIN HCl (JANUMET XR) 50-1000 MG TB24, 1 tablet, Disp: , Rfl:    Cardiovascular & other pertient studies:  Reviewed external labs and tests, independently interpreted  EKG 08/10/2022: Sinus rhythm 73 bpm Normal EKG   CT cardiac scoring 08/11/2022: LM: 1.21 LAD: 278 RCA: 101 LCX 0   Total: 380   IMPRESSION: Coronary calcium score of 380. This was 25 nd percentile for age and sex matched control.  1.  Aortic Atherosclerosis (ICD10-I70.0). 2. Hepatic steatosis.    Echocardiogram 05/08/2018: Left ventricle cavity is normal in size. Moderate concentric hypertrophy of the left ventricle. Normal global wall motion. Doppler evidence of  grade I (impaired) diastolic dysfunction, normal LAP. Calculated EF 55%. No hemodynamically significant valvular abnormality. Normal right atrial pressure.   Recent labs: 06/21/2022: eGFR 52 HbA1C 7.2% Chol 205, TG 169, HDL 51, LDL 124 TSH 1.4 normal  11/09/2020: BUN/Cr 22/1.2 K 5.7   Review of Systems  Cardiovascular:  Negative for chest pain, dyspnea on exertion, leg swelling, palpitations and syncope.        Vitals:   08/10/22 1128  BP: (!) 141/68  Pulse: 76  Resp: 16  SpO2: 96%    Body mass index is 28.12 kg/m. Filed Weights   08/10/22 1128  Weight: 169 lb (76.7 kg)     Objective:   Physical Exam Vitals and nursing note reviewed.  Constitutional:      General: She is not in acute distress. Neck:     Vascular: No JVD.  Cardiovascular:     Rate and Rhythm: Normal rate and regular rhythm.     Heart sounds: Normal heart sounds. No murmur heard. Pulmonary:     Effort: Pulmonary effort is normal.     Breath sounds: Normal breath sounds. No wheezing or rales.  Musculoskeletal:     Right lower leg: No edema.     Left lower leg: No edema.             Visit diagnoses:   ICD-10-CM   1. Agatston coronary artery calcium score between 100 and 400  R93.1 EKG 12-Lead    PCV MYOCARDIAL PERFUSION WO LEXISCAN    PCV ECHOCARDIOGRAM COMPLETE    2. Coronary artery disease involving native coronary artery of native heart without angina pectoris  I25.10 PCV MYOCARDIAL PERFUSION WO LEXISCAN    3. Mixed hyperlipidemia  E78.2 PCV MYOCARDIAL PERFUSION WO LEXISCAN    4. Essential hypertension  I10 PCV ECHOCARDIOGRAM COMPLETE    5. Decreased exercise tolerance  R68.89 PCV MYOCARDIAL PERFUSION WO LEXISCAN    PCV ECHOCARDIOGRAM COMPLETE    6. Aortic atherosclerosis (HCC)  I70.0 PCV MYOCARDIAL PERFUSION WO LEXISCAN       Orders Placed This Encounter  Procedures   PCV MYOCARDIAL PERFUSION WO LEXISCAN   EKG 12-Lead   PCV ECHOCARDIOGRAM COMPLETE     Meds  ordered this encounter  Medications   rosuvastatin (CRESTOR) 20 MG tablet    Sig: Take 1 tablet (20 mg total) by mouth daily.    Dispense:  90 tablet    Refill:  3     Assessment & Recommendations:   67 y.o.Caucasian female with hypertension, type 2 DM, elevated coronary calcium score  Elevated coronary calcium score: No definite chest pain, dyspnea, but has decreased exercise tolerance. Recommend Crestor 20 mg daily. Recommend exercise nuclear stress test and echocardiogram. Regardless of the test, her cardiac risk for kidney stone procedure is low.   Hypertension: BP slightly elevated today. Monitor for now.      Elder Negus, MD Pager: [phone] Office: [phone]

## 2022-08-10 NOTE — Progress Notes (Signed)
Spoke w/ via phone for pre-op interview--- pt Lab needs dos---- [employer], ekg              Lab results------ no COVID test -----patient states asymptomatic no test needed Arrive at ------- 1145 on 08-25-2022 NPO after MN NO Solid Food.  Clear liquids from MN until--- 1045 Med rec completed Medications to take morning of surgery ----- prilosec, diltiazem, fenofibrate, claritin Diabetic medication -----  do not take janumet or amaryl morning of surgery.  Do half dose tresiba insulin night before surgery Patient instructed no nail polish to be worn day of surgery Patient instructed to bring photo id and insurance card day of surgery Patient aware to have Driver (ride ) / caregiver    for 24 hours after surgery -- husband, Kimberly Duke Patient Special Instructions ----- n/a Pre-Op special Instructions ----- n/a Patient verbalized understanding of instructions that were given at this phone interview. Patient denies shortness of breath, chest pain, fever, cough at this phone interview.

## 2022-08-15 ENCOUNTER — Ambulatory Visit: Payer: Medicare Other

## 2022-08-15 DIAGNOSIS — E782 Mixed hyperlipidemia: Secondary | ICD-10-CM

## 2022-08-15 DIAGNOSIS — R6889 Other general symptoms and signs: Secondary | ICD-10-CM

## 2022-08-15 DIAGNOSIS — I2510 Atherosclerotic heart disease of native coronary artery without angina pectoris: Secondary | ICD-10-CM

## 2022-08-15 DIAGNOSIS — R931 Abnormal findings on diagnostic imaging of heart and coronary circulation: Secondary | ICD-10-CM

## 2022-08-15 DIAGNOSIS — I700 Atherosclerosis of aorta: Secondary | ICD-10-CM

## 2022-08-25 ENCOUNTER — Encounter (HOSPITAL_BASED_OUTPATIENT_CLINIC_OR_DEPARTMENT_OTHER): Payer: Self-pay | Admitting: Urology

## 2022-08-25 ENCOUNTER — Encounter (HOSPITAL_BASED_OUTPATIENT_CLINIC_OR_DEPARTMENT_OTHER)
Admission: RE | Disposition: A | Discharge status: Home / Self Care | Payer: Self-pay | Source: Home / Self Care | Attending: Urology

## 2022-08-25 ENCOUNTER — Other Ambulatory Visit: Payer: Self-pay

## 2022-08-25 ENCOUNTER — Ambulatory Visit (HOSPITAL_BASED_OUTPATIENT_CLINIC_OR_DEPARTMENT_OTHER): Payer: Medicare Other | Admitting: Anesthesiology

## 2022-08-25 ENCOUNTER — Ambulatory Visit (HOSPITAL_BASED_OUTPATIENT_CLINIC_OR_DEPARTMENT_OTHER)
Admission: RE | Admit: 2022-08-25 | Discharge: 2022-08-25 | Disposition: A | Discharge status: Home / Self Care | Payer: Medicare Other | Attending: Urology | Admitting: Urology

## 2022-08-25 DIAGNOSIS — E785 Hyperlipidemia, unspecified: Secondary | ICD-10-CM | POA: Diagnosis not present

## 2022-08-25 DIAGNOSIS — J4540 Moderate persistent asthma, uncomplicated: Secondary | ICD-10-CM | POA: Insufficient documentation

## 2022-08-25 DIAGNOSIS — J45909 Unspecified asthma, uncomplicated: Secondary | ICD-10-CM

## 2022-08-25 DIAGNOSIS — N200 Calculus of kidney: Secondary | ICD-10-CM | POA: Insufficient documentation

## 2022-08-25 DIAGNOSIS — K219 Gastro-esophageal reflux disease without esophagitis: Secondary | ICD-10-CM | POA: Diagnosis not present

## 2022-08-25 DIAGNOSIS — I2510 Atherosclerotic heart disease of native coronary artery without angina pectoris: Secondary | ICD-10-CM | POA: Diagnosis not present

## 2022-08-25 DIAGNOSIS — Z79899 Other long term (current) drug therapy: Secondary | ICD-10-CM | POA: Diagnosis not present

## 2022-08-25 DIAGNOSIS — Z7984 Long term (current) use of oral hypoglycemic drugs: Secondary | ICD-10-CM | POA: Diagnosis not present

## 2022-08-25 DIAGNOSIS — E119 Type 2 diabetes mellitus without complications: Secondary | ICD-10-CM | POA: Diagnosis not present

## 2022-08-25 DIAGNOSIS — I10 Essential (primary) hypertension: Secondary | ICD-10-CM | POA: Insufficient documentation

## 2022-08-25 DIAGNOSIS — Z794 Long term (current) use of insulin: Secondary | ICD-10-CM | POA: Diagnosis not present

## 2022-08-25 DIAGNOSIS — Z01818 Encounter for other preprocedural examination: Secondary | ICD-10-CM

## 2022-08-25 HISTORY — DX: Moderate persistent asthma, uncomplicated: J45.40

## 2022-08-25 HISTORY — DX: Postnasal drip: R09.82

## 2022-08-25 HISTORY — DX: Palpitations: R00.2

## 2022-08-25 HISTORY — DX: Personal history of colonic polyps: Z86.010

## 2022-08-25 HISTORY — DX: Calculus of kidney: N20.0

## 2022-08-25 HISTORY — DX: Chronic cough: R05.3

## 2022-08-25 HISTORY — DX: Personal history of adenomatous and serrated colon polyps: Z86.0101

## 2022-08-25 HISTORY — PX: HOLMIUM LASER APPLICATION: SHX5852

## 2022-08-25 HISTORY — PX: CYSTOSCOPY WITH RETROGRADE PYELOGRAM, URETEROSCOPY AND STENT PLACEMENT: SHX5789

## 2022-08-25 LAB — POCT I-STAT, CHEM 8
BUN: 19 mg/dL (ref 8–23)
Calcium, Ion: 1.22 mmol/L (ref 1.15–1.40)
Chloride: 105 mmol/L (ref 98–111)
Creatinine, Ser: 1.1 mg/dL — ABNORMAL HIGH (ref 0.44–1.00)
Glucose, Bld: 157 mg/dL — ABNORMAL HIGH (ref 70–99)
HCT: 32 % — ABNORMAL LOW (ref 36.0–46.0)
Hemoglobin: 10.9 g/dL — ABNORMAL LOW (ref 12.0–15.0)
Potassium: 4.4 mmol/L (ref 3.5–5.1)
Sodium: 140 mmol/L (ref 135–145)
TCO2: 23 mmol/L (ref 22–32)

## 2022-08-25 LAB — GLUCOSE, CAPILLARY: Glucose-Capillary: 98 mg/dL (ref 70–99)

## 2022-08-25 SURGERY — CYSTOURETEROSCOPY, WITH RETROGRADE PYELOGRAM AND STENT INSERTION
Anesthesia: General | Laterality: Bilateral

## 2022-08-25 MED ORDER — LIDOCAINE 2% (20 MG/ML) 5 ML SYRINGE
INTRAMUSCULAR | Status: DC | PRN
  Administered 2022-08-25: 60 mg via INTRAVENOUS

## 2022-08-25 MED ORDER — CELECOXIB 200 MG PO CAPS
ORAL_CAPSULE | ORAL | Status: AC
  Filled 2022-08-25: qty 1

## 2022-08-25 MED ORDER — SODIUM CHLORIDE 0.9 % IR SOLN
Status: DC | PRN
  Administered 2022-08-25: 6000 mL via INTRAVESICAL

## 2022-08-25 MED ORDER — LACTATED RINGERS IV SOLN: INTRAVENOUS | Status: DC

## 2022-08-25 MED ORDER — AMISULPRIDE (ANTIEMETIC) 5 MG/2ML IV SOLN
INTRAVENOUS | Status: AC
  Filled 2022-08-25: qty 2

## 2022-08-25 MED ORDER — ACETAMINOPHEN 160 MG/5ML PO SOLN: 325.0000 mg | ORAL | Status: DC | PRN

## 2022-08-25 MED ORDER — 0.9 % SODIUM CHLORIDE (POUR BTL) OPTIME
TOPICAL | Status: DC | PRN
  Administered 2022-08-25: 500 mL

## 2022-08-25 MED ORDER — PHENYLEPHRINE 80 MCG/ML (10ML) SYRINGE FOR IV PUSH (FOR BLOOD PRESSURE SUPPORT)
PREFILLED_SYRINGE | INTRAVENOUS | Status: DC | PRN
  Administered 2022-08-25: 80 ug via INTRAVENOUS

## 2022-08-25 MED ORDER — ONDANSETRON HCL 4 MG/2ML IJ SOLN: 4.0000 mg | Freq: Once | INTRAMUSCULAR | Status: DC | PRN

## 2022-08-25 MED ORDER — SENNOSIDES-DOCUSATE SODIUM 8.6-50 MG PO TABS: 1.0000 | ORAL_TABLET | Freq: Two times a day (BID) | ORAL | 0 refills | Status: DC

## 2022-08-25 MED ORDER — FENTANYL CITRATE (PF) 100 MCG/2ML IJ SOLN
INTRAMUSCULAR | Status: AC
  Filled 2022-08-25: qty 2

## 2022-08-25 MED ORDER — PROPOFOL 10 MG/ML IV BOLUS
INTRAVENOUS | Status: DC | PRN
  Administered 2022-08-25: 160 mg via INTRAVENOUS

## 2022-08-25 MED ORDER — OXYCODONE HCL 5 MG/5ML PO SOLN: 5.0000 mg | Freq: Once | ORAL | Status: DC | PRN

## 2022-08-25 MED ORDER — MIDAZOLAM HCL 2 MG/2ML IJ SOLN
INTRAMUSCULAR | Status: DC | PRN
  Administered 2022-08-25: 2 mg via INTRAVENOUS

## 2022-08-25 MED ORDER — CELECOXIB 200 MG PO CAPS
200.0000 mg | ORAL_CAPSULE | Freq: Once | ORAL | Status: AC
  Administered 2022-08-25: 200 mg via ORAL

## 2022-08-25 MED ORDER — KETOROLAC TROMETHAMINE 10 MG PO TABS: 10.0000 mg | ORAL_TABLET | Freq: Three times a day (TID) | ORAL | 0 refills | Status: DC | PRN

## 2022-08-25 MED ORDER — PHENYLEPHRINE 80 MCG/ML (10ML) SYRINGE FOR IV PUSH (FOR BLOOD PRESSURE SUPPORT)
PREFILLED_SYRINGE | INTRAVENOUS | Status: AC
  Filled 2022-08-25: qty 10

## 2022-08-25 MED ORDER — ONDANSETRON HCL 4 MG/2ML IJ SOLN
INTRAMUSCULAR | Status: DC | PRN
  Administered 2022-08-25: 4 mg via INTRAVENOUS

## 2022-08-25 MED ORDER — MEPERIDINE HCL 25 MG/ML IJ SOLN: 6.2500 mg | INTRAMUSCULAR | Status: DC | PRN

## 2022-08-25 MED ORDER — AMISULPRIDE (ANTIEMETIC) 5 MG/2ML IV SOLN
10.0000 mg | Freq: Once | INTRAVENOUS | Status: AC
  Administered 2022-08-25: 10 mg via INTRAVENOUS

## 2022-08-25 MED ORDER — ACETAMINOPHEN 325 MG PO TABS: 325.0000 mg | ORAL_TABLET | ORAL | Status: DC | PRN

## 2022-08-25 MED ORDER — ACETAMINOPHEN 500 MG PO TABS
ORAL_TABLET | ORAL | Status: AC
  Filled 2022-08-25: qty 2

## 2022-08-25 MED ORDER — CEPHALEXIN 500 MG PO CAPS: 500.0000 mg | ORAL_CAPSULE | Freq: Two times a day (BID) | ORAL | 0 refills | Status: AC

## 2022-08-25 MED ORDER — FENTANYL CITRATE (PF) 100 MCG/2ML IJ SOLN: 25.0000 ug | INTRAMUSCULAR | Status: DC | PRN

## 2022-08-25 MED ORDER — CEFAZOLIN SODIUM-DEXTROSE 2-4 GM/100ML-% IV SOLN
INTRAVENOUS | Status: AC
  Filled 2022-08-25: qty 100

## 2022-08-25 MED ORDER — FENTANYL CITRATE (PF) 100 MCG/2ML IJ SOLN
INTRAMUSCULAR | Status: DC | PRN
  Administered 2022-08-25 (×2): 25 ug via INTRAVENOUS
  Administered 2022-08-25 (×2): 50 ug via INTRAVENOUS

## 2022-08-25 MED ORDER — EPHEDRINE SULFATE-NACL 50-0.9 MG/10ML-% IV SOSY
PREFILLED_SYRINGE | INTRAVENOUS | Status: DC | PRN
  Administered 2022-08-25: 10 mg via INTRAVENOUS

## 2022-08-25 MED ORDER — ACETAMINOPHEN 500 MG PO TABS
1000.0000 mg | ORAL_TABLET | Freq: Once | ORAL | Status: AC
  Administered 2022-08-25: 1000 mg via ORAL

## 2022-08-25 MED ORDER — LIDOCAINE HCL (PF) 2 % IJ SOLN
INTRAMUSCULAR | Status: AC
  Filled 2022-08-25: qty 5

## 2022-08-25 MED ORDER — OXYCODONE HCL 5 MG PO TABS: 5.0000 mg | ORAL_TABLET | Freq: Once | ORAL | Status: DC | PRN

## 2022-08-25 MED ORDER — MIDAZOLAM HCL 2 MG/2ML IJ SOLN
INTRAMUSCULAR | Status: AC
  Filled 2022-08-25: qty 2

## 2022-08-25 MED ORDER — PROPOFOL 10 MG/ML IV BOLUS
INTRAVENOUS | Status: AC
  Filled 2022-08-25: qty 20

## 2022-08-25 MED ORDER — CEFAZOLIN SODIUM-DEXTROSE 2-4 GM/100ML-% IV SOLN
2.0000 g | INTRAVENOUS | Status: AC
  Administered 2022-08-25: 2 g via INTRAVENOUS

## 2022-08-25 MED ORDER — IOHEXOL 300 MG/ML  SOLN
INTRAMUSCULAR | Status: DC | PRN
  Administered 2022-08-25: 20 mL via URETHRAL

## 2022-08-25 MED ORDER — OXYCODONE-ACETAMINOPHEN 5-325 MG PO TABS: 1.0000 | ORAL_TABLET | Freq: Four times a day (QID) | ORAL | 0 refills | Status: DC | PRN

## 2022-08-25 MED ORDER — EPHEDRINE 5 MG/ML INJ
INTRAVENOUS | Status: AC
  Filled 2022-08-25: qty 5

## 2022-08-25 SURGICAL SUPPLY — 26 items
BAG DRAIN URO-CYSTO SKYTR STRL (DRAIN) ×1 IMPLANT
BAG DRN UROCATH (DRAIN) ×1
BASKET LASER NITINOL 1.9FR (BASKET) IMPLANT
BSKT STON RTRVL 120 1.9FR (BASKET) ×1
CATH URETL OPEN END 6FR 70 (CATHETERS) IMPLANT
CLOTH BEACON ORANGE TIMEOUT ST (SAFETY) ×1 IMPLANT
GLOVE BIO SURGEON STRL SZ7.5 (GLOVE) ×1 IMPLANT
GOWN STRL REUS W/TWL LRG LVL3 (GOWN DISPOSABLE) ×1 IMPLANT
GUIDEWIRE ANG ZIPWIRE 038X150 (WIRE) ×1 IMPLANT
GUIDEWIRE STR DUAL SENSOR (WIRE) ×1 IMPLANT
IV NS 1000ML (IV SOLUTION) ×1
IV NS 1000ML BAXH (IV SOLUTION) ×1 IMPLANT
IV NS IRRIG 3000ML ARTHROMATIC (IV SOLUTION) ×1 IMPLANT
KIT TURNOVER CYSTO (KITS) ×1 IMPLANT
LASER FIB FLEXIVA PULSE ID 365 (Laser) IMPLANT
MANIFOLD NEPTUNE II (INSTRUMENTS) ×1 IMPLANT
NS IRRIG 500ML POUR BTL (IV SOLUTION) ×1 IMPLANT
PACK CYSTO (CUSTOM PROCEDURE TRAY) ×1 IMPLANT
SLEEVE SCD COMPRESS KNEE MED (STOCKING) ×1 IMPLANT
STENT POLARIS 5FRX22 (STENTS) IMPLANT
SYR 10ML LL (SYRINGE) ×1 IMPLANT
TRACTIP FLEXIVA PULS ID 200XHI (Laser) IMPLANT
TRACTIP FLEXIVA PULSE ID 200 (Laser) ×1
TUBE CONNECTING 12X1/4 (SUCTIONS) ×1 IMPLANT
TUBE PU 8FR 16IN ENFIT (TUBING) IMPLANT
TUBING UROLOGY SET (TUBING) ×1 IMPLANT

## 2022-08-25 NOTE — Discharge Instructions (Addendum)
1 - You may have urinary urgency (bladder spasms) and bloody urine on / off with stent in place. This is normal. ° °2 - Call MD or go to ER for fever >102, severe pain / nausea / vomiting not relieved by medications, or acute change in medical status ° ° °Post Anesthesia Home Care Instructions ° °Activity: °Get plenty of rest for the remainder of the day. A responsible adult should stay with you for 24 hours following the procedure.  °For the next 24 hours, DO NOT: °-Drive a car °-Operate machinery °-Drink alcoholic beverages °-Take any medication unless instructed by your physician °-Make any legal decisions or sign important papers. ° °Meals: °Start with liquid foods such as gelatin or soup. Progress to regular foods as tolerated. Avoid greasy, spicy, heavy foods. If nausea and/or vomiting occur, drink only clear liquids until the nausea and/or vomiting subsides. Call your physician if vomiting continues. ° °Special Instructions/Symptoms: °Your throat may feel dry or sore from the anesthesia or the breathing tube placed in your throat during surgery. If this causes discomfort, gargle with warm salt water. The discomfort should disappear within 24 hours. ° °

## 2022-08-25 NOTE — Anesthesia Procedure Notes (Signed)
Procedure Name: LMA Insertion Date/Time: 08/25/2022 12:33 PM  Performed by: Francie Massing, CRNAPre-anesthesia Checklist: Patient identified, Emergency Drugs available, Suction available and Patient being monitored Patient Re-evaluated:Patient Re-evaluated prior to induction Oxygen Delivery Method: Circle system utilized Preoxygenation: Pre-oxygenation with 100% oxygen Induction Type: IV induction Ventilation: Mask ventilation without difficulty LMA: LMA inserted LMA Size: 4.0 Number of attempts: 1 Airway Equipment and Method: Bite block Placement Confirmation: positive ETCO2 Tube secured with: Tape Dental Injury: Teeth and Oropharynx as per pre-operative assessment

## 2022-08-25 NOTE — Transfer of Care (Signed)
Immediate Anesthesia Transfer of Care Note  Patient: Kimberly Duke  Procedure(s) Performed: Procedure(s) (LRB): CYSTOSCOPY WITH RETROGRADE PYELOGRAM, URETEROSCOPY AND STENT PLACEMENT (Bilateral) HOLMIUM LASER APPLICATION (Bilateral)  Patient Location: PACU  Anesthesia Type: General  Level of Consciousness: awake, oriented, sedated and patient cooperative  Airway & Oxygen Therapy: Patient Spontanous Breathing and Patient connected to face mask oxygen  Post-op Assessment: Report given to PACU RN and Post -op Vital signs reviewed and stable  Post vital signs: Reviewed and stable  Complications: No apparent anesthesia complications Last Vitals:  Vitals Value Taken Time  BP 147/63 08/25/22 1417  Temp 37.1 C 08/25/22 1418  Pulse 76 08/25/22 1420  Resp 17 08/25/22 1420  SpO2 97 % 08/25/22 1420  Vitals shown include unvalidated device data.  Last Pain:  Vitals:   08/25/22 1055  TempSrc: Oral         Complications: No notable events documented.

## 2022-08-25 NOTE — Brief Op Note (Signed)
08/25/2022  2:03 PM  PATIENT:  Kimberly Duke  67 y.o. female  PRE-OPERATIVE DIAGNOSIS:  BILATERAL RENAL STONES  POST-OPERATIVE DIAGNOSIS:  BILATERAL RENAL STONES  PROCEDURE:  Procedure(s) with comments: CYSTOSCOPY WITH RETROGRADE PYELOGRAM, URETEROSCOPY AND STENT PLACEMENT (Bilateral) - 90 MINS HOLMIUM LASER APPLICATION (Bilateral)  SURGEON:  Surgeon(s) and Role:    * Morena Mckissack, Delbert Phenix., MD - Primary  PHYSICIAN ASSISTANT:   ASSISTANTS: none   ANESTHESIA:   general  EBL:  minimal   BLOOD ADMINISTERED:none  DRAINS: none   LOCAL MEDICATIONS USED:  NONE  SPECIMEN:  No Specimen  DISPOSITION OF SPECIMEN:  N/A  COUNTS:  YES  TOURNIQUET:  * No tourniquets in log *  DICTATION: .Other Dictation: Dictation Number [id number]  PLAN OF CARE: Discharge to home after PACU  PATIENT DISPOSITION:  PACU - hemodynamically stable.   Delay start of Pharmacological VTE agent (>24hrs) due to surgical blood loss or risk of bleeding: not applicable

## 2022-08-25 NOTE — Op Note (Signed)
Kimberly Duke MEDICAL RECORD NO: [id number] ACCOUNT NO: [id number] DATE OF BIRTH: 1955/07/05 FACILITY: [facility] LOCATION: [facility] PHYSICIAN: Sebastian Ache, MD  Operative Report   DATE OF PROCEDURE: 08/25/2022  PREOPERATIVE DIAGNOSIS:  Left greater than right recurrent renal stones.  PROCEDURE PERFORMED:   1.  Cystoscopy with bilateral retrograde pyelograms interpretation. 2.  Bilateral ureteroscopy with laser lithotripsy and left ureteral stent placement.  ESTIMATED BLOOD LOSS:  Nil.  COMPLICATIONS:  None.  SPECIMEN:  None.  FINDINGS:   1.  Small papillary tip calcifications on the right. 2.  Large left intrarenal stone estimated at 1.7 cm. 3.  Complete resolution of all accessible stone fragments larger than one-third mm following laser lithotripsy and basket extraction. 4.  Placement of left ureteral stent, proximal end in the renal pelvis, distal end in urinary bladder.  No tether.  INDICATIONS:  The patient is a pleasant 67 year old lady with longstanding history of recurrent urolithiasis.  She is compliant with medical therapy.  She was found on surveillance imaging to have significant stone recurrence left greater than right with  left stone being well over a centimeter, but not to the size that require percutaneous approach surgery.  Options were discussed for management including continued surveillance versus preemptive treatment of the stones to prevent progression to  obstructing stones or need for percutaneous approach surgery and she wished to proceed with goal of stone free.  Informed consent was obtained and placed in medical record.  PROCEDURE IN DETAIL:  The patient being identified and verified, procedure being bilateral ureteroscopic stone manipulation was confirmed.  Procedure timeout was performed.  Intravenous antibiotics were administered.  General anesthesia was induced, the  patient placed into a low lithotomy position.  Sterile field was created,  prepped and draped the patient's vagina, introitus, and proximal thighs using iodine.  Next, cystourethroscopy deformity using 21-French rigid cystoscope with offset lens.   Inspection of urinary bladder revealed no diverticula, calcifications or papillary lesions.  There was mild cystocele noted.  The right ureteral orifice was cannulated with a 6-French end-hole catheter, and right retrograde pyelogram was obtained.  Right retrograde pyelogram demonstrated single right ureter, single system right kidney.  No filling defects or narrowing noted.  A ZIPwire was advanced to the level of the renal pelvis on the right side, set aside as a safety wire.  Next, left  retrograde pyelogram was obtained.  Left retrograde pyelogram demonstrated a single left ureter, single system left kidney.  There was a very large mobile filling defect in the renal pelvis consistent with known stone.  The ZIPwire was advanced to the level of the renal pelvis on the left  side, set aside as a safety wire.  An 8-French feeding tube placed in the urinary bladder for pressure release.  Semirigid ureteroscopy was then performed of the distal two-thirds of the left ureter alongside a separate sensor working wire.  No mucosal  abnormalities were found.  Next, semirigid ureteroscopy performed of the distal two-thirds right ureter alongside as a separate sensory working wire.  No mucosal abnormalities were found.  Semirigid scope was then exchanged for an 11/13 short length  ureteral access sheath to the level of proximal ureter using continuous fluoroscopic guidance and flexible digital ureteroscopy was performed of the proximal ureter and systematic inspection of the right kidney including all calyces x3.  There was  multifocal small papillary tip calcifications.  Some of these were just mucosal or submucosal.  These were amenable to laser ablation using settings of  1 joule and 10 Hz.  All papillary tip calcifications were ablated on the  right side.  Access sheath  was removed under continuous vision, no significant mucosal abnormalities were found.  Next, the access sheath was placed over the left sensor working wire at the level of proximal left ureter using continuous fluoroscopic guidance and flexible digital  ureteroscopy was performed of left kidney including all calyces x3.  There was a single stone very large free floating estimated to be approximately 1.7 cm.  This repositioned into a midpole calyceal area and holmium laser energy was  applied to the stone using escalating settings of 0.03 joules and 30 Hz.  Using a dusting technique very careful somewhat tedious ablation was performed of the stone essentially dusting 90-95% of all fragments.  This took approximately an hour and a  half.  I was quite happy with the resolution of the dominant stone.  The small residual fragments were then ablated using a popcorn technique with settings of 1.8 joules and 20 Hz, such that all fragments were less than one-third mm. Access sheath was  removed under continuous vision, no significant mucosal abnormalities were found.  Given the very large volume of stone in the innumerable small fragments and dust created, it was felt that interval stenting with a nontethered stent would be most  prudent.  As such, a new 5 x 22 Polaris type stent was placed just in the left ureter using fluoroscopic guidance.  Good proximal and distal plane were noted.  The right safety wire was removed and the procedure was terminated.  The patient tolerated  procedure well, no immediate complications.  The patient was taken to postanesthesia care in stable condition.  Plan for discharge home.  She will have her stent removed in the office after followup visits in approximately 2 weeks.   PUS D: 08/25/2022 2:09:15 pm T: 08/25/2022 7:37:00 pm  JOB: [id number]/ [id number]

## 2022-08-25 NOTE — Anesthesia Postprocedure Evaluation (Signed)
Anesthesia Post Note  Patient: Kimberly Duke  Procedure(s) Performed: CYSTOSCOPY WITH RETROGRADE PYELOGRAM, URETEROSCOPY AND STENT PLACEMENT (Bilateral) HOLMIUM LASER APPLICATION (Bilateral)     Patient location during evaluation: PACU Anesthesia Type: General Level of consciousness: awake and alert Pain management: pain level controlled Vital Signs Assessment: post-procedure vital signs reviewed and stable Respiratory status: spontaneous breathing, nonlabored ventilation and respiratory function stable Cardiovascular status: stable and blood pressure returned to baseline Anesthetic complications: no   No notable events documented.  Last Vitals:  Vitals:   08/25/22 1445 08/25/22 1454  BP: (!) 148/67   Pulse: 70 96  Resp: (!) 21 (!) 23  Temp:    SpO2: 98% 99%    Last Pain:  Vitals:   08/25/22 1445  TempSrc:   PainSc: 0-No pain                 Beryle Lathe

## 2022-08-25 NOTE — H&P (Signed)
Kimberly Duke is an 67 y.o. female.    Chief Complaint: Pre-OP BILATERAL Ureteroscopic Stone Manipulation  HPI:    1 - Recurrent Nephrolithiasis - ??Pre 2015 - SWL x 2 left sided, most recently abroung 1995 or so ?11/2013 - left URS / stent for 1.5 cm total volume left renal stone ??  [employer] Surveillance:  ?06/2022 KUB, Renal US - about 1.5cm Left renal pelvis / UJP stone, and scattered small pap tip calcs bilaterally ??  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  ?PMH sig for IDDM2, HLD, Benign Hyst. She gets most primary care through [facility] in clinic since Dr. Modesto Charon retired. ??  ?Today Kimberly Duke is seen to proceed with BILATERAL Ureteroscopic stone manipulation for L>R recurrent renla tones. No interval fefers. Most recent UA without infectious parameters.       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 dewald (as needed basis)  lov note in epic 08-02-2021   Osteoporosis    Palpitations    cardiologist--- dr Rosemary Holms  (as needed basis)  lov note in epic 05-12-2019  felt to be PAC/ PVC   PONV (postoperative nausea and vomiting)    Post-nasal drip    Renal calculus, bilateral    Thyroid nodule    monitored by dr Talmage Nap-  negartive bx's   Type 2 diabetes mellitus (HCC)     followed by dr Talmage Nap;    (08-10-2022  checks blood sugar 2-3 times daily ,  fasting average 120)   Upper airway cough syndrome    pulmologist-   dr Francine Graven   Wears glasses     Past Surgical History:  Procedure Laterality Date   COLONOSCOPY WITH ESOPHAGOGASTRODUODENOSCOPY (EGD)  04/28/2021   dr Dulce Sellar   CYSTOSCOPY WITH URETEROSCOPY AND STENT PLACEMENT Left 11/19/2013   Procedure: CYSTOSCOPY WITH URETEROSCOPY , RETROGRADE AND STENT PLACEMENT, STONE EXTRACTION;  Surgeon: Sebastian Ache, MD;  Location: [facility];  Service: Urology;  Laterality: Left;   EXTRACORPOREAL SHOCK WAVE LITHOTRIPSY Left x2  last one 1990's   HOLMIUM LASER APPLICATION Left 11/19/2013   Procedure: HOLMIUM LASER APPLICATION;  Surgeon: Sebastian Ache, MD;  Location: [facility];  Service: Urology;  Laterality: Left;   TOTAL ABDOMINAL HYSTERECTOMY W/ BILATERAL SALPINGOOPHORECTOMY  1997   TUBAL LIGATION  yrs ago   URETERAL REIMPLANTION Right 1993 (approx)   w/  endometriosis resectED from ureter    Family History  Problem Relation Age of Onset   Diabetes Mother    Hyperlipidemia Mother    COPD Father        smoked   Diabetes Sister    Breast cancer Paternal Grandmother  Cancer Paternal Grandmother        breast cancer (older onset)   Lung cancer Paternal Grandfather        smoked   Cancer Paternal Grandfather        lung (smoker)   Diabetes Brother    Hypertension Brother    Cancer Maternal Grandmother 66       breast cancer   Cancer Maternal Grandfather        lung cancer   Social History:  reports that she has never smoked. She has never used smokeless tobacco. She reports that she does not currently use alcohol. She reports that she does not use drugs.  Allergies:  Allergies  Allergen Reactions   Amoxicillin Hives   Darvon Nausea And Vomiting    No medications prior to admission.    No results found for this or any previous visit (from the past 48 hour(s)). No  results found.  Review of Systems  Constitutional:  Negative for chills and fever.  Genitourinary:  Positive for flank pain.  All other systems reviewed and are negative.   There were no vitals taken for this visit. Physical Exam Vitals reviewed.  HENT:     Head: Normocephalic.  Eyes:     Pupils: Pupils are equal, round, and reactive to light.  Cardiovascular:     Rate and Rhythm: Normal rate.  Pulmonary:     Effort: Pulmonary effort is normal.  Abdominal:     General: Abdomen is flat.  Genitourinary:    Comments: No CVAT at present Musculoskeletal:        General: Normal range of motion.     Cervical back: Normal range of motion.  Skin:    General: Skin is warm.  Neurological:     General: No focal deficit present.     Mental Status: She is alert.  Psychiatric:        Mood and Affect: Mood normal.      Assessment/Plan  Proceed as planned with BILATERAL ureteroscdopic stone manipulation. Risks, benefits, alternatives, expected peri-op course discussed previously and reiteratd today.   Loletta Parish., MD 08/25/2022, 7:18 AM

## 2022-08-25 NOTE — Anesthesia Preprocedure Evaluation (Signed)
Anesthesia Evaluation  Patient identified by MRN, date of birth, ID band Patient awake    Reviewed: Allergy & Precautions, H&P , NPO status , Patient's Chart, lab work & pertinent test results  History of Anesthesia Complications (+) PONV and history of anesthetic complications  Airway Mallampati: II  TM Distance: >3 FB Neck ROM: Full    Dental no notable dental hx.    Pulmonary neg pulmonary ROS, asthma    Pulmonary exam normal breath sounds clear to auscultation       Cardiovascular hypertension, Pt. on medications + CAD  negative cardio ROS Normal cardiovascular exam Rhythm:Regular Rate:Normal     Neuro/Psych negative neurological ROS  negative psych ROS   GI/Hepatic negative GI ROS, Neg liver ROS,GERD  Medicated,,  Endo/Other  diabetes, Type 2, Oral Hypoglycemic Agents    Renal/GU Renal disease  negative genitourinary   Musculoskeletal  (+) Arthritis ,    Abdominal   Peds negative pediatric ROS (+)  Hematology negative hematology ROS (+)   Anesthesia Other Findings   Reproductive/Obstetrics negative OB ROS                             Anesthesia Physical Anesthesia Plan  ASA: 3  Anesthesia Plan: General   Post-op Pain Management: Minimal or no pain anticipated, Celebrex PO (pre-op)* and Tylenol PO (pre-op)*   Induction: Intravenous  PONV Risk Score and Plan: 4 or greater and Ondansetron, Dexamethasone and TIVA  Airway Management Planned: LMA  Additional Equipment:   Intra-op Plan:   Post-operative Plan: Extubation in OR  Informed Consent: I have reviewed the patients History and Physical, chart, labs and discussed the procedure including the risks, benefits and alternatives for the proposed anesthesia with the patient or authorized representative who has indicated his/her understanding and acceptance.     Dental advisory given  Plan Discussed with: CRNA and  Anesthesiologist  Anesthesia Plan Comments:         Anesthesia Quick Evaluation

## 2022-08-28 ENCOUNTER — Encounter (HOSPITAL_BASED_OUTPATIENT_CLINIC_OR_DEPARTMENT_OTHER): Payer: Self-pay | Admitting: Urology

## 2022-09-06 ENCOUNTER — Ambulatory Visit: Payer: Medicare Other

## 2022-09-06 DIAGNOSIS — I10 Essential (primary) hypertension: Secondary | ICD-10-CM

## 2022-09-06 DIAGNOSIS — R6889 Other general symptoms and signs: Secondary | ICD-10-CM

## 2022-09-06 DIAGNOSIS — R931 Abnormal findings on diagnostic imaging of heart and coronary circulation: Secondary | ICD-10-CM

## 2022-10-03 ENCOUNTER — Ambulatory Visit: Payer: Managed Care, Other (non HMO) | Admitting: Cardiology

## 2022-10-03 NOTE — Progress Notes (Deleted)
Follow up visit  Subjective:   Kimberly Duke, female    DOB: 1955/12/03, 67 y.o.   MRN: [id number]   HPI  No chief complaint on file.   67 y.o.Caucasian female with hypertension, type 2 DM, elevated coronary calcium score  ***Patient was recently found to have elevated coronary calcium. She denies any chest pain, but has decreased exercise tolerance in general. On a separate note, she has an upcoming procedure for kidney stones on June 7, by Dr Sebastian Ache.    Current Outpatient Medications:    diltiazem (TIAZAC) 300 MG 24 hr capsule, Take 300 mg by mouth daily., Disp: , Rfl:    fenofibrate 160 MG tablet, Take 160 mg by mouth daily., Disp: , Rfl:    glimepiride (AMARYL) 4 MG tablet, Take 4 mg by mouth 2 (two) times daily., Disp: , Rfl:    insulin degludec (TRESIBA FLEXTOUCH) 100 UNIT/ML FlexTouch Pen, Inject 16-30 Units into the skin at bedtime. Per pt SS, Disp: , Rfl:    ketorolac (TORADOL) 10 MG tablet, Take 1 tablet (10 mg total) by mouth every 8 (eight) hours as needed for moderate pain (or stent discomfort post-operatively)., Disp: 20 tablet, Rfl: 0   loratadine (CLARITIN) 10 MG tablet, Take 10 mg by mouth daily., Disp: , Rfl:    Multiple Vitamin (MULTIVITAMIN) tablet, Take 1 tablet by mouth daily., Disp: , Rfl:    omeprazole (PRILOSEC) 40 MG capsule, Take 40 mg by mouth daily., Disp: , Rfl:    oxyCODONE-acetaminophen (PERCOCET) 5-325 MG tablet, Take 1 tablet by mouth every 6 (six) hours as needed for severe pain (post-operatively)., Disp: 15 tablet, Rfl: 0   Psyllium (METAMUCIL) 28.3 % POWD, Take by mouth daily., Disp: , Rfl:    rosuvastatin (CRESTOR) 20 MG tablet, Take 1 tablet (20 mg total) by mouth daily. (Patient taking differently: Take 20 mg by mouth daily.), Disp: 90 tablet, Rfl: 3   senna-docusate (SENOKOT-S) 8.6-50 MG tablet, Take 1 tablet by mouth 2 (two) times daily. While taking strongest pain meds to prevent constipation, Disp: 10 tablet, Rfl: 0    SitaGLIPtin-MetFORMIN HCl (JANUMET XR) 50-1000 MG TB24, Take 1 tablet by mouth 2 (two) times daily., Disp: , Rfl:    trolamine salicylate (ASPERCREME) 10 % cream, Apply 1 Application topically as needed for muscle pain., Disp: , Rfl:    Cardiovascular & other pertient studies:  Reviewed external labs and tests, independently interpreted  Exercise nuclear stress test 08/15/2022: Myocardial perfusion is normal. Overall LV systolic function is normal without regional wall motion abnormalities. Stress LV EF: 67%.  Normal ECG stress. The patient exercised for 6 minutes and 44 seconds of a Bruce protocol, achieving approximately 8.21 METs & 86% MPHR. The blood pressure response was normal. No chest pain.  No previous exam available for comparison. Low risk.    Echocardiogram 09/06/2022:  Normal LV systolic function with visual EF 60-65%. Left ventricle cavity  is normal in size. Normal left ventricular wall thickness. Normal global  wall motion. Normal diastolic filling pattern, normal LAP.  No significant valvular heart disease.  Compared to 05/08/2018: Grade 1 diastolic dysfunction is now normal  otherwise no significant change.   CT cardiac scoring 08/11/2022: LM: 1.21 LAD: 278 RCA: 101 LCX 0   Total: 380   IMPRESSION: Coronary calcium score of 380. This was 67 nd percentile for age and sex matched control.  1.  Aortic Atherosclerosis (ICD10-I70.0). 2. Hepatic steatosis.    Echocardiogram 05/08/2018: Left ventricle cavity is normal  in size. Moderate concentric hypertrophy of the left ventricle. Normal global wall motion. Doppler evidence of grade I (impaired) diastolic dysfunction, normal LAP. Calculated EF 55%. No hemodynamically significant valvular abnormality. Normal right atrial pressure.   Recent labs: 08/25/2022: Glucose ***, BUN/Cr ***/***. EGFR ***. Na/K ***/***. ***Rest of the CMP normal H/H ***/***. MCV ***. Platelets *** ***HbA1C ***% Chol ***, TG ***, HDL ***, LDL  *** ***TSH ***normal  06/21/2022: eGFR 52 HbA1C 7.2% Chol 205, TG 169, HDL 51, LDL 124 TSH 1.4 normal  11/09/2020: BUN/Cr 22/1.2 K 5.7   Review of Systems  Cardiovascular:  Negative for chest pain, dyspnea on exertion, leg swelling, palpitations and syncope.        There were no vitals filed for this visit.   There is no height or weight on file to calculate BMI. There were no vitals filed for this visit.    Objective:   Physical Exam Vitals and nursing note reviewed.  Constitutional:      General: She is not in acute distress. Neck:     Vascular: No JVD.  Cardiovascular:     Rate and Rhythm: Normal rate and regular rhythm.     Heart sounds: Normal heart sounds. No murmur heard. Pulmonary:     Effort: Pulmonary effort is normal.     Breath sounds: Normal breath sounds. No wheezing or rales.  Musculoskeletal:     Right lower leg: No edema.     Left lower leg: No edema.             Visit diagnoses: No diagnosis found.    No orders of the defined types were placed in this encounter.    No orders of the defined types were placed in this encounter.    Assessment & Recommendations:   67 y.o.Caucasian female with hypertension, type 2 DM, elevated coronary calcium score  Elevated coronary calcium score: No definite chest pain, dyspnea, but has decreased exercise tolerance. Recommend Crestor 20 mg daily. Recommend exercise nuclear stress test and echocardiogram. Regardless of the test, her cardiac risk for kidney stone procedure is low.   Hypertension: BP slightly elevated today. Monitor for now.      Elder Negus, MD Pager: [phone] Office: [phone]

## 2022-10-05 ENCOUNTER — Encounter: Payer: Self-pay | Admitting: Cardiology

## 2022-10-05 ENCOUNTER — Ambulatory Visit: Payer: Medicare Other | Admitting: Cardiology

## 2022-10-05 ENCOUNTER — Ambulatory Visit: Payer: Managed Care, Other (non HMO) | Admitting: Cardiology

## 2022-10-05 VITALS — BP 153/69 | HR 82 | Resp 17 | Ht 65.0 in | Wt 169.0 lb

## 2022-10-05 DIAGNOSIS — R931 Abnormal findings on diagnostic imaging of heart and coronary circulation: Secondary | ICD-10-CM

## 2022-10-05 DIAGNOSIS — I10 Essential (primary) hypertension: Secondary | ICD-10-CM

## 2022-10-05 DIAGNOSIS — I2510 Atherosclerotic heart disease of native coronary artery without angina pectoris: Secondary | ICD-10-CM

## 2022-10-05 DIAGNOSIS — E782 Mixed hyperlipidemia: Secondary | ICD-10-CM

## 2022-10-05 MED ORDER — ROSUVASTATIN CALCIUM 20 MG PO TABS: 20.0000 mg | ORAL_TABLET | Freq: Every day | ORAL | 3 refills | Status: DC

## 2022-10-05 NOTE — Progress Notes (Signed)
Follow up visit  Subjective:   Kimberly Duke, female    DOB: November 29, 1955, 67 y.o.   MRN: [id number]   HPI  Chief Complaint  Patient presents with   Palpitations   Follow-up    67 y.o.Caucasian female with hypertension, type 2 DM, elevated coronary calcium score  Patient is doing well, denies chest pain, shortness of breath, palpitations, leg edema, orthopnea, PND, TIA/syncope. Reviewed recent test results with the patient, details below.  Blood pressure is elevated. It appears that her urologist has prescribed her indapamide given her kidney stones, but she has not started it yet.     Current Outpatient Medications:    diltiazem (TIAZAC) 300 MG 24 hr capsule, Take 300 mg by mouth daily., Disp: , Rfl:    fenofibrate 160 MG tablet, Take 160 mg by mouth daily., Disp: , Rfl:    glimepiride (AMARYL) 4 MG tablet, Take 4 mg by mouth 2 (two) times daily., Disp: , Rfl:    insulin degludec (TRESIBA FLEXTOUCH) 100 UNIT/ML FlexTouch Pen, Inject 16-30 Units into the skin at bedtime. Per pt SS, Disp: , Rfl:    ketorolac (TORADOL) 10 MG tablet, Take 1 tablet (10 mg total) by mouth every 8 (eight) hours as needed for moderate pain (or stent discomfort post-operatively)., Disp: 20 tablet, Rfl: 0   loratadine (CLARITIN) 10 MG tablet, Take 10 mg by mouth daily., Disp: , Rfl:    Multiple Vitamin (MULTIVITAMIN) tablet, Take 1 tablet by mouth daily., Disp: , Rfl:    omeprazole (PRILOSEC) 40 MG capsule, Take 40 mg by mouth daily., Disp: , Rfl:    oxyCODONE-acetaminophen (PERCOCET) 5-325 MG tablet, Take 1 tablet by mouth every 6 (six) hours as needed for severe pain (post-operatively)., Disp: 15 tablet, Rfl: 0   Psyllium (METAMUCIL) 28.3 % POWD, Take by mouth daily., Disp: , Rfl:    rosuvastatin (CRESTOR) 20 MG tablet, Take 1 tablet (20 mg total) by mouth daily. (Patient taking differently: Take 20 mg by mouth daily.), Disp: 90 tablet, Rfl: 3   senna-docusate (SENOKOT-S) 8.6-50 MG tablet, Take 1 tablet by  mouth 2 (two) times daily. While taking strongest pain meds to prevent constipation, Disp: 10 tablet, Rfl: 0   SitaGLIPtin-MetFORMIN HCl (JANUMET XR) 50-1000 MG TB24, Take 1 tablet by mouth 2 (two) times daily., Disp: , Rfl:    trolamine salicylate (ASPERCREME) 10 % cream, Apply 1 Application topically as needed for muscle pain., Disp: , Rfl:    Cardiovascular & other pertient studies:  Reviewed external labs and tests, independently interpreted  EKG 08/10/2022: Sinus rhythm 73 bpm Normal EKG  Echocardiogram 09/06/2022: Normal LV systolic function with visual EF 60-65%. Left ventricle cavity is normal in size. Normal left ventricular wall thickness. Normal global wall motion. Normal diastolic filling pattern, normal LAP. No significant valvular heart disease. Compared to 05/08/2018: Grade 1 diastolic dysfunction is now normal otherwise no significant change.  Exercise nuclear stress test 08/15/2022: Myocardial perfusion is normal. Overall LV systolic function is normal without regional wall motion abnormalities. Stress LV EF: 67%.  Normal ECG stress. The patient exercised for 6 minutes and 44 seconds of a Bruce protocol, achieving approximately 8.21 METs & 86% MPHR. The blood pressure response was normal. No chest pain.  No previous exam available for comparison. Low risk.      CT cardiac scoring 08/11/2022: LM: 1.21 LAD: 278 RCA: 101 LCX 0   Total: 380   IMPRESSION: Coronary calcium score of 380. This was 62 nd percentile for age  and sex matched control.  1.  Aortic Atherosclerosis (ICD10-I70.0). 2. Hepatic steatosis.    Echocardiogram 05/08/2018: Left ventricle cavity is normal in size. Moderate concentric hypertrophy of the left ventricle. Normal global wall motion. Doppler evidence of grade I (impaired) diastolic dysfunction, normal LAP. Calculated EF 55%. No hemodynamically significant valvular abnormality. Normal right atrial pressure.   Recent  labs: 06/21/2022: eGFR 52 HbA1C 7.2% Chol 205, TG 169, HDL 51, LDL 124 TSH 1.4 normal  11/09/2020: BUN/Cr 22/1.2 K 5.7   Review of Systems  Cardiovascular:  Negative for chest pain, dyspnea on exertion, leg swelling, palpitations and syncope.        Vitals:   10/05/22 1425  BP: (!) 153/69  Pulse: 82  Resp: 17  SpO2: 99%     Body mass index is 28.12 kg/m. Filed Weights   10/05/22 1425  Weight: 169 lb (76.7 kg)      Objective:   Physical Exam Vitals and nursing note reviewed.  Constitutional:      General: She is not in acute distress. Neck:     Vascular: No JVD.  Cardiovascular:     Rate and Rhythm: Normal rate and regular rhythm.     Heart sounds: Normal heart sounds. No murmur heard. Pulmonary:     Effort: Pulmonary effort is normal.     Breath sounds: Normal breath sounds. No wheezing or rales.  Musculoskeletal:     Right lower leg: No edema.     Left lower leg: No edema.             Visit diagnoses:   ICD-10-CM   1. Coronary artery disease involving native coronary artery of native heart without angina pectoris  I25.10     2. Agatston coronary artery calcium score between 100 and 400  R93.1     3. Mixed hyperlipidemia  E78.2 Lipid panel    4. Essential hypertension  I10         Orders Placed This Encounter  Procedures   Lipid panel     No orders of the defined types were placed in this encounter.    Assessment & Recommendations:   67 y.o.Caucasian female with hypertension, type 2 DM, elevated coronary calcium score  Elevated coronary calcium score: 92nd percentile (07/2022). Structurally normal heart on echocardiogram 08/2022. No ischemia on stress testing 07/2022. Continue Crestor 20 mg daily. Check lipid panel.  Hypertension: Discussed low-salt diet. Continue diltiazem 300 mg daily. She is prescribed indapamide by her urologist for kidney stones.  This may reduce blood pressure to some extent.  If remains elevated in  spite of starting indapamide, will consider adding additional antihypertensive agent such as ARB/ACE.  F/u in 3 months      Kimberly Negus, MD Pager: [phone] Office: [phone]

## 2022-10-23 ENCOUNTER — Ambulatory Visit: Payer: Managed Care, Other (non HMO) | Admitting: Cardiology

## 2022-10-24 ENCOUNTER — Ambulatory Visit (INDEPENDENT_AMBULATORY_CARE_PROVIDER_SITE_OTHER): Payer: Medicare Other | Admitting: Adult Health

## 2022-10-24 ENCOUNTER — Ambulatory Visit (INDEPENDENT_AMBULATORY_CARE_PROVIDER_SITE_OTHER): Payer: Medicare Other

## 2022-10-24 ENCOUNTER — Encounter: Payer: Self-pay | Admitting: Adult Health

## 2022-10-24 VITALS — BP 132/58 | HR 94 | Temp 98.7°F | Ht 67.0 in | Wt 163.8 lb

## 2022-10-24 DIAGNOSIS — J4531 Mild persistent asthma with (acute) exacerbation: Secondary | ICD-10-CM | POA: Diagnosis not present

## 2022-10-24 DIAGNOSIS — J4540 Moderate persistent asthma, uncomplicated: Secondary | ICD-10-CM

## 2022-10-24 DIAGNOSIS — R058 Other specified cough: Secondary | ICD-10-CM

## 2022-10-24 DIAGNOSIS — J45909 Unspecified asthma, uncomplicated: Secondary | ICD-10-CM | POA: Insufficient documentation

## 2022-10-24 LAB — POCT EXHALED NITRIC OXIDE: FeNO level (ppb): 15

## 2022-10-24 MED ORDER — BENZONATATE 200 MG PO CAPS: 200.0000 mg | ORAL_CAPSULE | Freq: Three times a day (TID) | ORAL | 3 refills | Status: AC | PRN

## 2022-10-24 MED ORDER — BUDESONIDE-FORMOTEROL FUMARATE 80-4.5 MCG/ACT IN AERO: 2.0000 | INHALATION_SPRAY | Freq: Two times a day (BID) | RESPIRATORY_TRACT | 12 refills | Status: DC

## 2022-10-24 MED ORDER — AZITHROMYCIN 250 MG PO TABS: ORAL_TABLET | ORAL | 0 refills | Status: AC

## 2022-10-24 NOTE — Progress Notes (Signed)
@Patient  ID: Kimberly Duke, female    DOB: [birth year], 67 y.o.   MRN: [id number]  Chief Complaint  Patient presents with   Follow-up    Referring provider: Moshe Cipro, NP  HPI: 67 yo female never smoker followed for Asthma, chronic cough and Chronic rhinitis  Medical history significant for GERD   TEST/EVENTS :  PFT 07/2020 normal, FEV1 79%, ratio 68 , FVC 90, DLCO 97%   EGD 04/28/21 by Debor[facility] Sprang GI noted to have short segment Barrett's esophagus. -Rx PPI   10/24/2022 Acute OV  Patient presents for an acute office visit.  Last seen May 2023 she is followed for Asthma and chronic rhinitis. She complains over last 4 months she has had increased cough, minimally productive.  Says it is hard to get up with thick mucus.  Feels like it stuck in her upper chest. Has a deep cough with pleuritic rib pain. Coughs so hard she has stress incontinence.  Has tried Flovent and Symbicort in the past without perceived benefit.  Takes claritin daily for sinus drainage . Take PPI daily for GERD. Not taking any cough meds.  FENO 15ppb.  Patient said she has had a cough that has waxed and waned over the last 20 years.  Seems to get better for a little while then will come back.  Allergies  Allergen Reactions   Amoxicillin Hives   Darvon Nausea And Vomiting    Immunization History  Administered Date(s) Administered   Influenza Split 01/18/2013, 02/17/2021   PFIZER(Purple Top)SARS-COV-2 Vaccination 06/30/2019, 07/22/2019   PNEUMOCOCCAL CONJUGATE-20 04/19/2021   Tdap 09/30/2012, 05/27/2014   Zoster, Live 06/17/2021    Past Medical History:  Diagnosis Date   Arthritis    Barrett esophagus 04/28/2021   per EGD done by dr outlaw short segment , not dysplasia   Chronic cough    Coronary artery calcification seen on CAT scan 07/19/2022   calcium score= 380 involving LAD/ RCA/ LM   GERD (gastroesophageal reflux disease)    History of adenomatous polyp of colon    History of endometriosis     History of hypothyroidism    History of kidney stones    Hyperlipidemia    Hypertension    Moderate persistent asthma    (08-10-2022  per pt does have rescue inhaler )pulmolonary -   dr j. dewald (as needed basis)  lov note in epic 08-02-2021   Osteoporosis    Palpitations    cardiologist--- dr Rosemary Holms  (as needed basis)  lov note in epic 05-12-2019  felt to be PAC/ PVC   PONV (postoperative nausea and vomiting)    Post-nasal drip    Renal calculus, bilateral    Thyroid nodule    monitored by dr Talmage Nap-  negartive bx's   Type 2 diabetes mellitus (HCC)    followed by dr Talmage Nap;    (08-10-2022  checks blood sugar 2-3 times daily ,  fasting average 120)   Upper airway cough syndrome    pulmologist-   dr Francine Graven   Wears glasses     Tobacco History: Social History   Tobacco Use  Smoking Status Never  Smokeless Tobacco Never   Counseling given: Not Answered   Outpatient Medications Prior to Visit  Medication Sig Dispense Refill   diltiazem (TIAZAC) 300 MG 24 hr capsule Take 300 mg by mouth daily.     fenofibrate 160 MG tablet Take 160 mg by mouth daily.     glimepiride (AMARYL) 4 MG tablet Take 4 mg  by mouth 2 (two) times daily.     indapamide (LOZOL) 1.25 MG tablet Take 1.25 mg by mouth daily.     insulin degludec (TRESIBA FLEXTOUCH) 100 UNIT/ML FlexTouch Pen Inject 16-30 Units into the skin at bedtime. Per pt SS     loratadine (CLARITIN) 10 MG tablet Take 10 mg by mouth daily.     Multiple Vitamin (MULTIVITAMIN) tablet Take 1 tablet by mouth daily.     omeprazole (PRILOSEC) 40 MG capsule Take 40 mg by mouth daily.     Psyllium (METAMUCIL) 28.3 % POWD Take by mouth daily.     rosuvastatin (CRESTOR) 20 MG tablet Take 1 tablet (20 mg total) by mouth daily. 90 tablet 3   SitaGLIPtin-MetFORMIN HCl (JANUMET XR) 50-1000 MG TB24 Take 1 tablet by mouth 2 (two) times daily.     trolamine salicylate (ASPERCREME) 10 % cream Apply 1 Application topically as needed for muscle pain.     No  facility-administered medications prior to visit.     Review of Systems:   Constitutional:   No  weight loss, night sweats,  Fevers, chills, fatigue, or  lassitude.  HEENT:   No headaches,  Difficulty swallowing,  Tooth/dental problems, or  Sore throat,                No sneezing, itching, ear ache, + nasal congestion, post nasal drip,   CV:  No chest pain,  Orthopnea, PND, swelling in lower extremities, anasarca, dizziness, palpitations, syncope.   GI  No heartburn, indigestion, abdominal pain, nausea, vomiting, diarrhea, change in bowel habits, loss of appetite, bloody stools.   Resp: .  No chest wall deformity  Skin: no rash or lesions.  GU: no dysuria, change in color of urine, no urgency or frequency.  No flank pain, no hematuria   MS:  No joint pain or swelling.  No decreased range of motion.  No back pain.    Physical Exam  BP (!) 132/58 (BP Location: Left Arm, Patient Position: Sitting, Cuff Size: Large)   Pulse 94   Temp 98.7 F (37.1 C) (Oral)   Ht 5\' 7"  (1.702 m)   Wt 163 lb 12.8 oz (74.3 kg)   SpO2 97%   BMI 25.65 kg/m   GEN: A/Ox3; pleasant , NAD, well nourished    HEENT:  Dante/AT,  NOSE-clear, THROAT-clear, no lesions, no postnasal drip or exudate noted.   NECK:  Supple w/ fair ROM; no JVD; normal carotid impulses w/o bruits; no thyromegaly or nodules palpated; no lymphadenopathy.    RESP  few scattered rhonchi, no accessory muscle use, no dullness to percussion  CARD:  RRR, no m/r/g, no peripheral edema, pulses intact, no cyanosis or clubbing.  GI:   Soft & nt; nml bowel sounds; no organomegaly or masses detected.   Musco: Warm bil, no deformities or joint swelling noted.   Neuro: alert, no focal deficits noted.    Skin: Warm, no lesions or rashes    Lab Results:  CBC   BMET   BNP No results found for: "BNP"  ProBNP No results found for: "PROBNP"  Imaging: DG Chest 2 View  Result Date: 10/24/2022 CLINICAL DATA:  Cough, asthma  EXAM: CHEST - 2 VIEW COMPARISON:  06/07/2020 FINDINGS: Cardiac size is within normal limits. There are no signs of pulmonary edema or focal pulmonary consolidation. There is no pleural effusion or pneumothorax. IMPRESSION: There are no signs of pulmonary edema or new focal infiltrates. Electronically Signed   By: Rhae Hammock  Rathinasamy M.D.   On: 10/24/2022 15:13    Administration History     None          Latest Ref Rng & Units 08/09/2020    9:44 AM  PFT Results  FVC-Pre L 2.99   FVC-Predicted Pre % 89   FVC-Post L 3.02   FVC-Predicted Post % 90   Pre FEV1/FVC % % 68   Post FEV1/FCV % % 70   FEV1-Pre L 2.04   FEV1-Predicted Pre % 79   FEV1-Post L 2.12   DLCO uncorrected ml/min/mmHg 20.31   DLCO UNC% % 97   DLCO corrected ml/min/mmHg 20.31   DLCO COR %Predicted % 97   DLVA Predicted % 106   TLC L 5.48   TLC % Predicted % 103   RV % Predicted % 110     No results found for: "NITRICOXIDE"      Assessment & Plan:   Upper airway cough syndrome Upper airway cough syndrome.-Check chest x-ray today.  Treat for triggers such as postnasal drip and reflux.  Change Claritin to Zyrtec.  Continue on Prilosec.  Add in cough control regimen  Plan  Patient Instructions  Zpack take as directed.  Begin Symbicort 2 puffs Twice daily , rinse after use.  Change Claritin to Zyrtec 10mg  At bedtime   Delsym 2 tsp Twice daily  for cough As needed   Tessalon Three times a day  for cough As needed   Continue on Prilosec daily  Sips of water to soothe throat and avoid coughing and throat clearing  Chest xray today .  Follow up with Dr. Francine Graven in 2 month and As needed   Please contact office for sooner follow up if symptoms do not improve or worsen or seek emergency care        Asthmatic bronchitis Acute asthmatic bronchitis.  Will treat with empiric antibiotics.  Symptomatic regimen for cough and postnasal drainage  Plan  Patient Instructions  Zpack take as directed.  Begin  Symbicort 2 puffs Twice daily , rinse after use.  Change Claritin to Zyrtec 10mg  At bedtime   Delsym 2 tsp Twice daily  for cough As needed   Tessalon Three times a day  for cough As needed   Continue on Prilosec daily  Sips of water to soothe throat and avoid coughing and throat clearing  Chest xray today .  Follow up with Dr. Francine Graven in 2 month and As needed   Please contact office for sooner follow up if symptoms do not improve or worsen or seek emergency care          Rubye Oaks, NP 10/24/2022

## 2022-10-24 NOTE — Assessment & Plan Note (Signed)
Acute asthmatic bronchitis.  Will treat with empiric antibiotics.  Symptomatic regimen for cough and postnasal drainage  Plan  Patient Instructions  Zpack take as directed.  Begin Symbicort 2 puffs Twice daily , rinse after use.  Change Claritin to Zyrtec 10mg  At bedtime   Delsym 2 tsp Twice daily  for cough As needed   Tessalon Three times a day  for cough As needed   Continue on Prilosec daily  Sips of water to soothe throat and avoid coughing and throat clearing  Chest xray today .  Follow up with Dr. Francine Graven in 2 month and As needed   Please contact office for sooner follow up if symptoms do not improve or worsen or seek emergency care

## 2022-10-24 NOTE — Assessment & Plan Note (Signed)
Upper airway cough syndrome.-Check chest x-ray today.  Treat for triggers such as postnasal drip and reflux.  Change Claritin to Zyrtec.  Continue on Prilosec.  Add in cough control regimen  Plan  Patient Instructions  Zpack take as directed.  Begin Symbicort 2 puffs Twice daily , rinse after use.  Change Claritin to Zyrtec 10mg  At bedtime   Delsym 2 tsp Twice daily  for cough As needed   Tessalon Three times a day  for cough As needed   Continue on Prilosec daily  Sips of water to soothe throat and avoid coughing and throat clearing  Chest xray today .  Follow up with Dr. Francine Graven in 2 month and As needed   Please contact office for sooner follow up if symptoms do not improve or worsen or seek emergency care

## 2022-10-24 NOTE — Patient Instructions (Addendum)
Zpack take as directed.  Begin Symbicort 2 puffs Twice daily , rinse after use.  Change Claritin to Zyrtec 10mg  At bedtime   Delsym 2 tsp Twice daily  for cough As needed   Tessalon Three times a day  for cough As needed   Continue on Prilosec daily  Sips of water to soothe throat and avoid coughing and throat clearing  Chest xray today .  Follow up with Dr. Francine Graven in 2 month and As needed   Please contact office for sooner follow up if symptoms do not improve or worsen or seek emergency care

## 2022-10-26 NOTE — Progress Notes (Signed)
Patient seen in the office today and instructed on use of Symbicort.  Patient expressed understanding and demonstrated technique. ° °

## 2022-11-07 ENCOUNTER — Telehealth: Payer: Self-pay | Admitting: Pulmonary Disease

## 2022-11-07 NOTE — Telephone Encounter (Signed)
Patient is calling because she still is sick after seeing Tammy on the 6th. She's been taking  Breyna instead of Symbacort. Her coughing isnt improving, she's weak and has night sweats. Please call and advise.

## 2022-11-16 NOTE — Telephone Encounter (Signed)
Spoke with patient.  She is better and was seen elsewhere.

## 2023-01-05 ENCOUNTER — Ambulatory Visit: Payer: Medicare Other | Admitting: Cardiology

## 2023-02-22 ENCOUNTER — Other Ambulatory Visit: Payer: Self-pay | Admitting: Family Medicine

## 2023-02-22 DIAGNOSIS — E2839 Other primary ovarian failure: Secondary | ICD-10-CM

## 2023-03-08 ENCOUNTER — Encounter: Payer: Self-pay | Admitting: Family Medicine

## 2023-04-13 ENCOUNTER — Ambulatory Visit: Payer: Medicare Other | Attending: Cardiology | Admitting: Cardiology

## 2023-04-13 ENCOUNTER — Encounter: Payer: Self-pay | Admitting: Cardiology

## 2023-04-13 VITALS — BP 142/68 | HR 88 | Resp 16 | Ht 67.0 in | Wt 168.0 lb

## 2023-04-13 DIAGNOSIS — R931 Abnormal findings on diagnostic imaging of heart and coronary circulation: Secondary | ICD-10-CM | POA: Diagnosis present

## 2023-04-13 DIAGNOSIS — I2510 Atherosclerotic heart disease of native coronary artery without angina pectoris: Secondary | ICD-10-CM | POA: Diagnosis present

## 2023-04-13 DIAGNOSIS — Z794 Long term (current) use of insulin: Secondary | ICD-10-CM | POA: Insufficient documentation

## 2023-04-13 DIAGNOSIS — E119 Type 2 diabetes mellitus without complications: Secondary | ICD-10-CM | POA: Diagnosis not present

## 2023-04-13 DIAGNOSIS — E782 Mixed hyperlipidemia: Secondary | ICD-10-CM | POA: Insufficient documentation

## 2023-04-13 DIAGNOSIS — I10 Essential (primary) hypertension: Secondary | ICD-10-CM | POA: Diagnosis present

## 2023-04-13 MED ORDER — ASPIRIN 81 MG PO TBEC: 81.0000 mg | DELAYED_RELEASE_TABLET | Freq: Every day | ORAL | 3 refills | Status: DC

## 2023-04-13 NOTE — Patient Instructions (Signed)
Medication Instructions:   START TAKING ASPIRIN 81 MG BY MOUTH DAILY  *If you need a refill on your cardiac medications before your next appointment, please call your pharmacy*   Lab Work:  SOMETIME NEXT WEEK IN THIS BUILDING FIRST FLOOR AT [facility]--LIPIDS AND A1C--PLEASE COME FASTING TO THIS LAB APPOINTMENT  If you have labs (blood work) drawn today and your tests are completely normal, you will receive your results only by: MyChart Message (if you have MyChart) OR A paper copy in the mail If you have any lab test that is abnormal or we need to change your treatment, we will call you to review the results.     Follow-Up: At [facility], you and your health needs are our priority.  As part of our continuing mission to provide you with exceptional heart care, we have created designated Provider Care Teams.  These Care Teams include your primary Cardiologist (physician) and Advanced Practice Providers (APPs -  Physician Assistants and Nurse Practitioners) who all work together to provide you with the care you need, when you need it.  We recommend signing up for the patient portal called "MyChart".  Sign up information is provided on this After Visit Summary.  MyChart is used to connect with patients for Virtual Visits (Telemedicine).  Patients are able to view lab/test results, encounter notes, upcoming appointments, etc.  Non-urgent messages can be sent to your provider as well.   To learn more about what you can do with MyChart, go to [email or web address].    Your next appointment:   6 month(s)  Provider:   DR. Rosemary Holms

## 2023-04-13 NOTE — Progress Notes (Addendum)
Cardiology Office Note:  .   Date:  04/13/2023  ID:  Kimberly Duke, DOB 02-Jan-1956, MRN [id number] PCP: No primary care provider on file.  [facility] Providers Cardiologist:  Truett Mainland, MD PCP: No primary care provider on file.  Chief Complaint  Patient presents with   Coronary artery disease involving native coronary artery of   Follow-up    3 month      History of Present Illness: .    Kimberly Duke is a 68 y.o. female with hypertension, type 2 DM, elevated coronary calcium score   Patient had kidney stone removed few months ago.  Otherwise, doing well and denies any complaints.  Blood pressure elevated but in the left lower than this.  She tells me that her losartan was stopped by either endocrinologist or nephrologist and she was placed on indapamide.  Diabetes was uncontrolled in September, but she has made some changes since then.  Vitals:   04/13/23 1030  BP: (!) 144/70  Pulse: 88  Resp: 16  SpO2: 98%     ROS:  Review of Systems  Cardiovascular:  Negative for chest pain, dyspnea on exertion, leg swelling, palpitations and syncope.     Studies Reviewed: Marland Kitchen        Independently interpreted 12/2022: Chol 140, TG 291, HDL 39, LDL 64 HbA1C 9.9% Hb 11.2 Cr 1.19 K 4.9  06/21/2022: eGFR 52 HbA1C 7.2% Chol 205, TG 169, HDL 51, LDL 124 TSH 1.4 normal  Echocardiogram 09/06/2022: Normal LV systolic function with visual EF 60-65%. Left ventricle cavity is normal in size. Normal left ventricular wall thickness. Normal global wall motion. Normal diastolic filling pattern, normal LAP. No significant valvular heart disease. Compared to 05/08/2018: Grade 1 diastolic dysfunction is now normal otherwise no significant change.  Exercise nuclear stress test 08/15/2022: Myocardial perfusion is normal. Overall LV systolic function is normal without regional wall motion abnormalities. Stress LV EF: 67%.  Normal ECG stress. The patient exercised for 6  minutes and 44 seconds of a Bruce protocol, achieving approximately 8.21 METs & 86% MPHR. The blood pressure response was normal. No chest pain.  No previous exam available for comparison. Low risk.       CT cardiac scoring 08/11/2022: LM: 1.21 LAD: 278 RCA: 101 LCX 0   Total: 380   IMPRESSION: Coronary calcium score of 380. This was 59 nd percentile for age and sex matched control.   1.  Aortic Atherosclerosis (ICD10-I70.0). 2. Hepatic steatosis.   Physical Exam:   Physical Exam Vitals and nursing note reviewed.  Constitutional:      General: She is not in acute distress. Neck:     Vascular: No JVD.  Cardiovascular:     Rate and Rhythm: Normal rate and regular rhythm.     Heart sounds: Normal heart sounds. No murmur heard. Pulmonary:     Effort: Pulmonary effort is normal.     Breath sounds: Normal breath sounds. No wheezing or rales.  Musculoskeletal:     Right lower leg: No edema.     Left lower leg: No edema.      VISIT DIAGNOSES:   ICD-10-CM   1. Essential hypertension  I10     2. Agatston coronary artery calcium score between 100 and 400  R93.1 Lipid Profile    aspirin EC 81 MG tablet    Lipid Profile    3. Type 2 diabetes mellitus without complication, with long-term current use of insulin (HCC)  E11.9 HgB A1c  Z79.4 HgB A1c    4. Elevated cholesterol with elevated triglycerides  E78.2 Lipid Profile    Lipid Profile    5. Coronary artery disease involving native coronary artery of native heart without angina pectoris  I25.10        ASSESSMENT AND PLAN: .    Kimberly Duke is a 67 y.o. female with hypertension, type 2 DM, elevated coronary calcium score    Elevated coronary calcium score: 92nd percentile (07/2022). Structurally normal heart on echocardiogram 08/2022. No ischemia on stress testing 07/2022. Continue Crestor 20 mg daily. Check fasting lipid panel given elevated triglycerides on most recent blood panel, most likely due to  uncontrolled diabetes. Given no bleeding contraindication, would start on aspirin 81 mg daily, benefits outweigh risks.   Hypertension: Blood pressure slightly elevated today. Ideally, I wanted to start her on losartan for hypertension, as well as renal protection.  However, she indicated to me that this.  I either endocrinologist or nephrologist.  I will defer management to them.    Type 2 diabetes mellitus: Uncontrolled, with some recent changes in medications.  A1c was 9.9% in 12/2022.  I will repeat this, along with fasting lipid panel        Meds ordered this encounter  Medications   aspirin EC 81 MG tablet    Sig: Take 1 tablet (81 mg total) by mouth daily. Swallow whole.    Dispense:  90 tablet    Refill:  3     F/u in 6 months  Signed, Elder Negus, MD

## 2023-04-18 LAB — LIPID PANEL
Chol/HDL Ratio: 3.4 {ratio} (ref 0.0–4.4)
Cholesterol, Total: 131 mg/dL (ref 100–199)
HDL: 39 mg/dL — ABNORMAL LOW (ref >39)
LDL Chol Calc (NIH): 54 mg/dL (ref 0–99)
Triglycerides: 242 mg/dL — ABNORMAL HIGH (ref 0–149)
VLDL Cholesterol Cal: 38 mg/dL (ref 5–40)

## 2023-04-19 ENCOUNTER — Encounter: Payer: Self-pay | Admitting: Cardiology

## 2023-04-19 LAB — HEMOGLOBIN A1C
Est. average glucose Bld gHb Est-mCnc: 206 mg/dL
Hgb A1c MFr Bld: 8.8 % — ABNORMAL HIGH (ref 4.8–5.6)

## 2023-09-24 ENCOUNTER — Other Ambulatory Visit: Payer: Self-pay | Admitting: Family Medicine

## 2023-09-24 DIAGNOSIS — Z1231 Encounter for screening mammogram for malignant neoplasm of breast: Secondary | ICD-10-CM

## 2023-11-01 ENCOUNTER — Other Ambulatory Visit: Payer: Medicare Other

## 2023-11-02 ENCOUNTER — Ambulatory Visit (HOSPITAL_BASED_OUTPATIENT_CLINIC_OR_DEPARTMENT_OTHER)
Admission: RE | Admit: 2023-11-02 | Discharge: 2023-11-02 | Disposition: A | Discharge status: Home / Self Care | Source: Ambulatory Visit | Attending: Family Medicine | Admitting: Family Medicine

## 2023-11-02 ENCOUNTER — Ambulatory Visit
Admission: RE | Admit: 2023-11-02 | Discharge: 2023-11-02 | Disposition: A | Discharge status: Home / Self Care | Source: Ambulatory Visit | Attending: Family Medicine | Admitting: Family Medicine

## 2023-11-02 DIAGNOSIS — Z1231 Encounter for screening mammogram for malignant neoplasm of breast: Secondary | ICD-10-CM

## 2023-11-02 DIAGNOSIS — E2839 Other primary ovarian failure: Secondary | ICD-10-CM | POA: Insufficient documentation

## 2024-01-01 ENCOUNTER — Other Ambulatory Visit: Payer: Self-pay | Admitting: Cardiology

## 2024-02-19 ENCOUNTER — Ambulatory Visit: Admitting: Pulmonary Disease

## 2024-02-19 ENCOUNTER — Encounter: Payer: Self-pay | Admitting: Pulmonary Disease

## 2024-02-19 VITALS — BP 150/81 | HR 80 | Ht 66.0 in | Wt 168.0 lb

## 2024-02-19 DIAGNOSIS — J309 Allergic rhinitis, unspecified: Secondary | ICD-10-CM | POA: Diagnosis not present

## 2024-02-19 DIAGNOSIS — J4540 Moderate persistent asthma, uncomplicated: Secondary | ICD-10-CM

## 2024-02-19 DIAGNOSIS — K2270 Barrett's esophagus without dysplasia: Secondary | ICD-10-CM

## 2024-02-19 DIAGNOSIS — K219 Gastro-esophageal reflux disease without esophagitis: Secondary | ICD-10-CM

## 2024-02-19 DIAGNOSIS — I10 Essential (primary) hypertension: Secondary | ICD-10-CM

## 2024-02-19 DIAGNOSIS — R058 Other specified cough: Secondary | ICD-10-CM

## 2024-02-19 DIAGNOSIS — R053 Chronic cough: Secondary | ICD-10-CM | POA: Diagnosis not present

## 2024-02-19 LAB — CBC WITH DIFFERENTIAL/PLATELET
Basophils Absolute: 0.1 K/uL (ref 0.0–0.1)
Basophils Relative: 1 % (ref 0.0–3.0)
Eosinophils Absolute: 0.3 K/uL (ref 0.0–0.7)
Eosinophils Relative: 4 % (ref 0.0–5.0)
HCT: 33.4 % — ABNORMAL LOW (ref 36.0–46.0)
Hemoglobin: 11.1 g/dL — ABNORMAL LOW (ref 12.0–15.0)
Lymphocytes Relative: 26.9 % (ref 12.0–46.0)
Lymphs Abs: 2.1 K/uL (ref 0.7–4.0)
MCHC: 33.1 g/dL (ref 30.0–36.0)
MCV: 81.6 fl (ref 78.0–100.0)
Monocytes Absolute: 0.6 K/uL (ref 0.1–1.0)
Monocytes Relative: 7.1 % (ref 3.0–12.0)
Neutro Abs: 4.8 K/uL (ref 1.4–7.7)
Neutrophils Relative %: 61 % (ref 43.0–77.0)
Platelets: 202 K/uL (ref 150.0–400.0)
RBC: 4.1 Mil/uL (ref 3.87–5.11)
RDW: 14 % (ref 11.5–15.5)
WBC: 7.9 K/uL (ref 4.0–10.5)

## 2024-02-19 NOTE — Progress Notes (Signed)
 Established Patient Pulmonology Office Visit   Subjective:  Patient ID: Kimberly Duke, female    DOB: 01-Nov-1955  MRN: [id number]  CC:  Chief Complaint  Patient presents with   Medical Management of Chronic Issues    Pt states INH did not help     Discussed the use of AI scribe software for clinical note transcription with the patient, who gave verbal consent to proceed.  History of Present Illness Kimberly Duke is a 68 year old with GERD and rhinitis/postnasal drip who presents with chronic cough.  She has had a chronic cough for about 40 years without benefit from Symbicort  2 puffs twice daily, prior steroids, or antibiotics. Evening humidifier use helps throat dryness only. Elevating her head and taking cetirizine  or loratadine have not improved the cough.  She has GERD and Barrett's esophagus and has taken omeprazole for over a year without cough improvement. Triggers include pine-scented trash bags, dust, candle smoke, heat, and prolonged sitting.  She denies wheezing or shortness of breath with the cough, except for occasional nasal wheeze in quiet settings. She notes a film of mucus across her throat and intermittent dryness or itchiness high in her throat, transiently relieved by lemon drops. The cough rarely wakes her, only when her throat becomes very dry.  Prior evaluation includes a chest radiograph and pulmonary function testing in 2022. Feno on October 24, 2022 was 15. She underwent endoscopy in 2023 for Barrett's esophagus.        ROS    Current Outpatient Medications:    aspirin  EC 81 MG tablet, Take 1 tablet (81 mg total) by mouth daily. Swallow whole., Disp: 90 tablet, Rfl: 3   diltiazem  (TIAZAC ) 300 MG 24 hr capsule, Take 300 mg by mouth daily., Disp: , Rfl:    fenofibrate 160 MG tablet, Take 160 mg by mouth daily., Disp: , Rfl:    FEROSUL 325 (65 Fe) MG tablet, Take 325 mg by mouth daily., Disp: , Rfl:    indapamide (LOZOL) 1.25 MG tablet, Take 1.25 mg by  mouth daily., Disp: , Rfl:    insulin degludec (TRESIBA FLEXTOUCH) 100 UNIT/ML FlexTouch Pen, Inject 34 Units into the skin at bedtime. Per pt SS, Disp: , Rfl:    loratadine (CLARITIN) 10 MG tablet, Take 10 mg by mouth daily., Disp: , Rfl:    Multiple Vitamin (MULTIVITAMIN) tablet, Take 1 tablet by mouth daily., Disp: , Rfl:    omeprazole (PRILOSEC) 40 MG capsule, Take 40 mg by mouth daily., Disp: , Rfl:    rosuvastatin  (CRESTOR ) 20 MG tablet, Take 1 tablet by mouth once daily, Disp: 90 tablet, Rfl: 2   SitaGLIPtin-MetFORMIN HCl (JANUMET XR) 50-1000 MG TB24, Take 1 tablet by mouth 2 (two) times daily., Disp: , Rfl:       Objective:  BP (!) 150/81   Pulse 80   Ht 5' 6 (1.676 m) Comment: per pt  Wt 168 lb (76.2 kg)   SpO2 96%   BMI 27.12 kg/m     Physical Exam Constitutional:      General: She is not in acute distress.    Appearance: Normal appearance.  Eyes:     General: No scleral icterus.    Conjunctiva/sclera: Conjunctivae normal.  Cardiovascular:     Rate and Rhythm: Normal rate and regular rhythm.  Pulmonary:     Breath sounds: No wheezing, rhonchi or rales.  Musculoskeletal:     Right lower leg: No edema.     Left lower  leg: No edema.  Skin:    General: Skin is warm and dry.  Neurological:     General: No focal deficit present.      Diagnostic Review:       Assessment & Plan:   Assessment & Plan Upper airway cough syndrome     Moderate persistent asthma without complication  Orders:   CBC with Differential/Platelet; Future   IgE; Future   Pulmonary Function Test; Future  Chronic cough  Orders:   Ambulatory referral to ENT   Pulmonary Function Test; Future   Assessment and Plan Assessment & Plan Chronic cough Differential includes asthma vs upper airway cough syndrome and exposure-related causes. Mild obstruction on past pulmonary function tests. No acute cardiopulmonary issues on chest radiograph. Potential allergic inflammation  considered. - Ordered CBC and IgE level. - Referred to ENT for flexible laryngoscopy. - Scheduled pulmonary function tests. - Discontinued Symbicort  inhaler as not effective  Allergic rhinitis with postnasal drip Symptoms include throat dryness and itchiness. Previous antihistamine use. ENT evaluation needed for anatomical abnormalities. - Referred to ENT for evaluation of upper airway.  Gastroesophageal reflux disease with Barrett's esophagus GERD with Barrett's esophagus managed with omeprazole. GERD not linked to chronic cough.  Hypertension Elevated blood pressure noted. - Recheck blood presure      Return in about 6 months (around 08/19/2024) for f/u visit Dr. Kara.   Dorn KATHEE Kara, MD

## 2024-02-19 NOTE — Patient Instructions (Addendum)
 Schedule pulmonary function tests at the front desk   We will refer you to the ENT team for further evaluation of your on going cough symptoms  Continue omeprazole for reflux and barrett's esophagus  We will check CBC and IgE levels today and consider possible biologic therapy for your asthma/cough  Follow up in 6 months, call sooner if needed

## 2024-02-19 NOTE — Assessment & Plan Note (Addendum)
 SABRA

## 2024-02-20 ENCOUNTER — Encounter (INDEPENDENT_AMBULATORY_CARE_PROVIDER_SITE_OTHER): Payer: Self-pay

## 2024-02-21 LAB — IGE: IgE (Immunoglobulin E), Serum: 48 kU/L (ref <=114)

## 2024-02-22 ENCOUNTER — Ambulatory Visit: Payer: Self-pay | Admitting: Pulmonary Disease

## 2024-03-17 ENCOUNTER — Ambulatory Visit (INDEPENDENT_AMBULATORY_CARE_PROVIDER_SITE_OTHER)

## 2024-03-17 DIAGNOSIS — J4540 Moderate persistent asthma, uncomplicated: Secondary | ICD-10-CM

## 2024-03-17 DIAGNOSIS — R053 Chronic cough: Secondary | ICD-10-CM

## 2024-03-17 LAB — PULMONARY FUNCTION TEST
DL/VA % pred: 115 %
DL/VA: 4.75 ml/min/mmHg/L
DLCO cor % pred: 102 %
DLCO cor: 21.08 ml/min/mmHg
DLCO unc % pred: 94 %
DLCO unc: 19.43 ml/min/mmHg
FEF 25-75 Post: 1.57 L/s
FEF 25-75 Pre: 0.92 L/s
FEF2575-%Change-Post: 70 %
FEF2575-%Pred-Post: 75 %
FEF2575-%Pred-Pre: 44 %
FEV1-%Change-Post: 19 %
FEV1-%Pred-Post: 78 %
FEV1-%Pred-Pre: 66 %
FEV1-Post: 1.95 L
FEV1-Pre: 1.63 L
FEV1FVC-%Change-Post: 3 %
FEV1FVC-%Pred-Pre: 83 %
FEV6-%Change-Post: 17 %
FEV6-%Pred-Post: 95 %
FEV6-%Pred-Pre: 81 %
FEV6-Post: 2.98 L
FEV6-Pre: 2.53 L
FEV6FVC-%Change-Post: 1 %
FEV6FVC-%Pred-Post: 104 %
FEV6FVC-%Pred-Pre: 102 %
FVC-%Change-Post: 15 %
FVC-%Pred-Post: 91 %
FVC-%Pred-Pre: 79 %
FVC-Post: 2.98 L
FVC-Pre: 2.57 L
Post FEV1/FVC ratio: 65 %
Post FEV6/FVC ratio: 100 %
Pre FEV1/FVC ratio: 64 %
Pre FEV6/FVC Ratio: 99 %
RV % pred: 151 %
RV: 3.37 L
TLC % pred: 119 %
TLC: 6.3 L

## 2024-03-17 NOTE — Patient Instructions (Signed)
 Full PFT performed today.

## 2024-03-17 NOTE — Progress Notes (Signed)
 Full PFT performed today.

## 2024-03-26 ENCOUNTER — Ambulatory Visit (INDEPENDENT_AMBULATORY_CARE_PROVIDER_SITE_OTHER)

## 2024-03-26 ENCOUNTER — Encounter (INDEPENDENT_AMBULATORY_CARE_PROVIDER_SITE_OTHER): Payer: Self-pay

## 2024-03-26 VITALS — BP 139/78 | HR 84 | Ht 66.0 in | Wt 170.0 lb

## 2024-03-26 DIAGNOSIS — R053 Chronic cough: Secondary | ICD-10-CM

## 2024-03-26 NOTE — Progress Notes (Signed)
 Dear Dr. Kara, Here is my assessment for our mutual patient, Kimberly Duke. Thank you for allowing me the opportunity to care for your patient. Please do not hesitate to contact me should you have any other questions. Sincerely, Dr. Hadassah Parody  Otolaryngology Clinic Note Referring provider: Dr. Kara HPI:   Initial HPI (03/26/2024)  Kimberly Duke is a 69 year old with asthma and Barrett's esophagus who presents for evaluation of chronic cough.  Chronic cough - Persistent cough present for many years, worsening over time  - Initially triggered by sharp inhalation, now occurs daily - Episodes last over five minutes, most prominent in the morning and after periods of inactivity - Exacerbated by physical activity and positional changes, especially when lying on left side - No nocturnal cough awakening from sleep, but may cough after rising at night - Associated symptoms: rhinorrhea, lacrimation, globus sensation, occasional sputum expectoration  Asthma and pulmonary history - History of asthma and prior episodes of bronchitis - Diagnosed with bronchitis/asthma by pulmonology - Pulmonary function tests on March 17, 2024, showed significant change compared to 2022 per patient report was told her asthma is significantly worse than prior.  She has not yet followed up with pulmonology - Previously used Symbicort  without benefit, discontinued - Prescribed albuterol  but has not used it - No dyspnea or chest tightness   Environmental allergies - Allergic to pine-scented trash bags, dust, and heavy perfumes - Triggers sneezing but not cough    Independent Review of Additional Tests or Records:  Referral note 02/19/24 Dorn Kara, MD: Chronic cough for 40 years without benefit from Symbicort  2 puffs twice daily.  She has GERD and Barrett's esophagus.  Taking omeprazole for over a year without cough improvement.  Triggers include pine scented trash bags, dust, candle smoky and  prolonged sitting.  Refer to ENT for evaluation of upper airway cough syndrome.  Mild obstruction on past pulmonary function tests.  Planning on scheduling pulmonary function test  Chest x-ray 10/24/2022 independently reviewed and without acute abnormalities  02/19/2024 IgE 48  PMH/Meds/All/SocHx/FamHx/ROS:   Past Medical History:  Diagnosis Date   Arthritis    Barrett esophagus 04/28/2021   per EGD done by dr outlaw short segment , not dysplasia   Chronic cough    Coronary artery calcification seen on CAT scan 07/19/2022   calcium  score= 380 involving LAD/ RCA/ LM   GERD (gastroesophageal reflux disease)    History of adenomatous polyp of colon    History of endometriosis    History of hypothyroidism    History of kidney stones    Hyperlipidemia    Hypertension    Moderate persistent asthma    (08-10-2022  per pt does have rescue inhaler )pulmolonary -   dr j. dewald (as needed basis)  lov note in epic 08-02-2021   Osteoporosis    Palpitations    cardiologist--- dr elmira  (as needed basis)  lov note in epic 05-12-2019  felt to be PAC/ PVC   PONV (postoperative nausea and vomiting)    Post-nasal drip    Renal calculus, bilateral    Thyroid nodule    monitored by dr tommas-  negartive bx's   Type 2 diabetes mellitus (HCC)    followed by dr tommas;    (08-10-2022  checks blood sugar 2-3 times daily ,  fasting average 120)   Upper airway cough syndrome    pulmologist-   dr kara   Wears glasses      Past Surgical History:  Procedure Laterality Date   COLONOSCOPY WITH ESOPHAGOGASTRODUODENOSCOPY (EGD)  04/28/2021   dr burnette   CYSTOSCOPY WITH RETROGRADE PYELOGRAM, URETEROSCOPY AND STENT PLACEMENT Bilateral 08/25/2022   Procedure: CYSTOSCOPY WITH RETROGRADE PYELOGRAM, URETEROSCOPY AND STENT PLACEMENT;  Surgeon: Alvaro Ricardo KATHEE Mickey., MD;  Location: [facility];  Service: Urology;  Laterality: Bilateral;  90 MINS   CYSTOSCOPY WITH URETEROSCOPY AND STENT PLACEMENT  Left 11/19/2013   Procedure: CYSTOSCOPY WITH URETEROSCOPY , RETROGRADE AND STENT PLACEMENT, STONE EXTRACTION;  Surgeon: Ricardo Alvaro, MD;  Location: [facility];  Service: Urology;  Laterality: Left;   EXTRACORPOREAL SHOCK WAVE LITHOTRIPSY Left x2  last one 1990's   HOLMIUM LASER APPLICATION Left 11/19/2013   Procedure: HOLMIUM LASER APPLICATION;  Surgeon: Ricardo Alvaro, MD;  Location: [facility];  Service: Urology;  Laterality: Left;   HOLMIUM LASER APPLICATION Bilateral 08/25/2022   Procedure: HOLMIUM LASER APPLICATION;  Surgeon: Alvaro Ricardo KATHEE Mickey., MD;  Location: [facility];  Service: Urology;  Laterality: Bilateral;   TOTAL ABDOMINAL HYSTERECTOMY W/ BILATERAL SALPINGOOPHORECTOMY  1997   TUBAL LIGATION  yrs ago   URETERAL REIMPLANTION Right 1993 (approx)   w/  endometriosis resectED from ureter    Family History  Problem Relation Age of Onset   Diabetes Mother    Hyperlipidemia Mother    COPD Father        smoked   Diabetes Sister    Breast cancer Paternal Grandmother    Cancer Paternal Grandmother        breast cancer (older onset)   Lung cancer Paternal Grandfather        smoked   Cancer Paternal Grandfather        lung (smoker)   Diabetes Brother    Hypertension Brother    Cancer Maternal Grandmother 59       breast cancer   Cancer Maternal Grandfather        lung cancer     Social Connections: Not on file     Current Outpatient Medications  Medication Instructions   aspirin  EC 81 mg, Oral, Daily, Swallow whole.   diltiazem  (TIAZAC ) 300 mg, Daily   fenofibrate 160 mg, Daily   FeroSul 325 mg, Daily   indapamide (LOZOL) 1.25 mg, Daily   insulin aspart protamine- aspart (NOVOLOG MIX 70/30) (70-30) 100 UNIT/ML injection 1 Units, 2 times daily with meals   loratadine (CLARITIN) 10 mg, Daily   Multiple Vitamin (MULTIVITAMIN) tablet 1 tablet, Daily   omeprazole (PRILOSEC) 40 mg, Daily   rosuvastatin  (CRESTOR ) 20 mg,  Oral, Daily   SitaGLIPtin-MetFORMIN HCl (JANUMET XR) 50-1000 MG TB24 1 tablet, 2 times daily   Tresiba FlexTouch 34 Units, Daily at bedtime     Physical Exam:   BP 139/78 (BP Location: Left Arm, Patient Position: Sitting)   Pulse 84   Ht 5' 6 (1.676 m)   Wt 170 lb (77.1 kg)   SpO2 94%   BMI 27.44 kg/m   Salient findings:  CN II-XII intact    Bilateral EAC clear and TM intact with well pneumatized middle ear spaces  Anterior rhinoscopy: Septum midline anteriorly; bilateral inferior turbinates with hypertrophy  No lesions of oral cavity/oropharynx  No obviously palpable neck masses/lymphadenopathy/thyromegaly  No respiratory distress or stridor TFL was indicated to better evaluate the proximal airway, given the patient's history and exam findings, and is detailed below.    Seprately Identifiable Procedures:  Prior to initiating any procedures, risks/benefits/alternatives were explained to the patient and verbal  consent obtained.  Procedure Note (03/26/2024) Pre-procedure diagnosis: Chronic cough Post-procedure diagnosis: Same Procedure: Transnasal Fiberoptic Laryngoscopy, CPT 31575 - Mod 25 Indication: Chronic cough Complications: None apparent EBL: 0 mL  The procedure was undertaken to further evaluate the patient's complaint of chronic cough, with mirror exam inadequate for appropriate examination due to gag reflex and poor patient tolerance  Procedure:  Patient was identified as correct patient. Verbal consent was obtained. The nose was sprayed with oxymetazoline and 4% lidocaine . The The flexible laryngoscope was passed through the nose to view the nasal cavity, pharynx (oropharynx, hypopharynx) and larynx.  The larynx was examined at rest and during multiple phonatory tasks. Documentation was obtained and reviewed with patient. The scope was removed. The patient tolerated the procedure well.  Findings: The nasal cavity and nasopharynx did not reveal any masses or  lesions, mucosa appeared to be without obvious lesions. The tongue base, pharyngeal walls, piriform sinuses, vallecula, epiglottis and postcricoid region are normal in appearance. The visualized portion of the subglottis and proximal trachea is widely patent. The vocal folds are mobile bilaterally. There are no lesions on the free edge of the vocal folds nor elsewhere in the larynx worrisome for malignancy.    Electronically signed by: Hadassah JAYSON Parody, MD 03/30/2024 3:48 PM   Impression & Plans:  Kimberly Duke is a 69 y.o. female with   1. Chronic cough     Assessment and Plan Assessment & Plan Chronic cough Chronic cough has persisted for years with recent worsening in frequency and severity. Flexible laryngoscopy with nasal anesthesia revealed no structural abnormalities, irritation, or swelling. Reflux is well controlled on current therapy, and nocturnal cough is absent. Poorly controlled asthma is the most likely etiology given that she reports her PFTs showed worsened asthma, though laryngeal hypersensitivity and post-infectious cough remain in the differential. Asthma management is prioritized before considering additional cough suppression therapies. - Recommended pulmonology follow-up to review pulmonary function test results and optimize asthma management. - Advised that if cough persists after asthma is well controlled, further evaluation and consideration of cough suppression therapies (e.g., voice therapy, gabapentin) may be warranted. - Provided reassurance that laryngoscopy revealed no concerning findings. - Encouraged her to contact if cough remains problematic after asthma optimization.    See below regarding exact medications prescribed this encounter including dosages and route: No orders of the defined types were placed in this encounter.     Thank you for allowing me the opportunity to care for your patient. Please do not hesitate to contact me should you have any  other questions.  Sincerely, Hadassah Parody, MD Otolaryngologist (ENT), [facility] Phone: [phone] Fax: [phone]  MDM:  Level 4 Complexity/Problems addressed: 4-chronic worsening problem Data complexity: 4-  independent review of chest x-ray, referral note, 1 lab - Morbidity: -   - Prescription Drug prescribed or managed: no

## 2024-04-08 ENCOUNTER — Telehealth: Payer: Self-pay | Admitting: Cardiology

## 2024-04-08 ENCOUNTER — Telehealth: Payer: Self-pay

## 2024-04-08 DIAGNOSIS — J4540 Moderate persistent asthma, uncomplicated: Secondary | ICD-10-CM

## 2024-04-08 NOTE — Telephone Encounter (Signed)
" °*  STAT* If patient is at the pharmacy, call can be transferred to refill team.   1. Which medications need to be refilled? (please list name of each medication and dose if known)   rosuvastatin  (CRESTOR ) 20 MG tablet     2. Would you like to learn more about the convenience, safety, & potential cost savings by using the [facility]? No   3. Are you open to using the [facility] (Type [facility].  ). No    4. Which pharmacy/location (including street and city if local pharmacy) is medication to be sent to? [id number] - [place], [place] - [address] AT [address]     5. Do they need a 30 day or 90 day supply? 90  Pt is currently out   "

## 2024-04-08 NOTE — Telephone Encounter (Signed)
 Copied from CRM [id number]. Topic: Clinical - Medication Question >> Apr 08, 2024  9:31 AM Russell PARAS wrote: Reason for CRM:   Pt is contacting clinic regarding her recent ENT evaluation and PFT testing. She is requesting if an inhaler/medication can be called into pharmacy to assist in controlling her asthma.   Requests call from nurse to speak about further and answer any questions  CB#  [phone]    Spoke with patient will send message  to MD and will reach out as soon as I know something

## 2024-04-09 ENCOUNTER — Telehealth (HOSPITAL_BASED_OUTPATIENT_CLINIC_OR_DEPARTMENT_OTHER): Payer: Self-pay

## 2024-04-09 MED ORDER — BREZTRI AEROSPHERE 160-9-4.8 MCG/ACT IN AERO: 2.0000 | INHALATION_SPRAY | Freq: Two times a day (BID) | RESPIRATORY_TRACT | 11 refills | Status: AC

## 2024-04-09 NOTE — Telephone Encounter (Signed)
 Breztri  2 puffs twice daily sent to her pharmacy. - rinse mouth out after each use

## 2024-04-09 NOTE — Telephone Encounter (Signed)
" °  Please see previous telephone encounter as note has been added to it to not create duplicate  Copied from CRM #[id number]. Topic: Clinical - Medication Question >> Apr 09, 2024 12:05 PM Rilla B wrote: Reason for CRM: Patient calling stating she is waiting on an update on a new medication or does Dr Kara want to revisit other astma medication.  Please call patient @ [phone]. "

## 2024-04-09 NOTE — Telephone Encounter (Signed)
"   Patient calling stating she is waiting on an update on a new medication or does Dr Kara want to revisit other astma medication.  Please call patient @ [phone].   "

## 2024-04-09 NOTE — Addendum Note (Signed)
 Addended by: KARA SIMMONDS on: 04/09/2024 05:28 PM   Modules accepted: Orders

## 2024-04-10 NOTE — Telephone Encounter (Signed)
 Tried to reach out to patient VM/ LM okay per DPR -

## 2024-04-15 MED ORDER — ROSUVASTATIN CALCIUM 20 MG PO TABS: 20.0000 mg | ORAL_TABLET | Freq: Every day | ORAL | 0 refills | Status: DC

## 2024-04-17 ENCOUNTER — Ambulatory Visit: Attending: Cardiology | Admitting: Cardiology

## 2024-04-17 ENCOUNTER — Other Ambulatory Visit (HOSPITAL_COMMUNITY): Payer: Self-pay

## 2024-04-17 ENCOUNTER — Encounter: Payer: Self-pay | Admitting: Cardiology

## 2024-04-17 VITALS — BP 155/74 | HR 91 | Ht 66.0 in | Wt 169.4 lb

## 2024-04-17 DIAGNOSIS — I25118 Atherosclerotic heart disease of native coronary artery with other forms of angina pectoris: Secondary | ICD-10-CM | POA: Insufficient documentation

## 2024-04-17 DIAGNOSIS — E782 Mixed hyperlipidemia: Secondary | ICD-10-CM | POA: Diagnosis present

## 2024-04-17 DIAGNOSIS — Z794 Long term (current) use of insulin: Secondary | ICD-10-CM | POA: Insufficient documentation

## 2024-04-17 DIAGNOSIS — R931 Abnormal findings on diagnostic imaging of heart and coronary circulation: Secondary | ICD-10-CM | POA: Insufficient documentation

## 2024-04-17 DIAGNOSIS — E119 Type 2 diabetes mellitus without complications: Secondary | ICD-10-CM | POA: Diagnosis present

## 2024-04-17 MED ORDER — ASPIRIN 81 MG PO TBEC
81.0000 mg | DELAYED_RELEASE_TABLET | Freq: Every day | ORAL | 3 refills | Status: AC
  Filled 2024-04-17: qty 90, 90d supply, fill #0

## 2024-04-17 MED ORDER — LOSARTAN POTASSIUM 50 MG PO TABS
50.0000 mg | ORAL_TABLET | Freq: Every day | ORAL | 3 refills | Status: AC
  Filled 2024-04-17: qty 90, 90d supply, fill #0

## 2024-04-17 MED ORDER — ROSUVASTATIN CALCIUM 20 MG PO TABS
20.0000 mg | ORAL_TABLET | Freq: Every day | ORAL | 3 refills | Status: AC
  Filled 2024-04-17 (×2): qty 90, 90d supply, fill #0

## 2024-04-17 NOTE — Progress Notes (Signed)
 " Cardiology Office Note:  .   Date:  04/17/2024  ID:  Kimberly Duke, DOB 08/20/1955, MRN [id number] PCP: Claudene Lacks, MD  [facility] Providers Cardiologist:  Newman Lawrence, MD PCP: Claudene Lacks, MD  Chief Complaint  Patient presents with   Coronary calcification      History of Present Illness: .    Kimberly Duke is a 69 y.o. female with hypertension, type 2 DM, elevated coronary calcium  score   Patient denies any chest pain, is now on treatment for possible asthma through a pulmonologist.  Diabetes remains extremely uncontrolled.  Blood pressure is elevated today.  She has not been taking aspirin .  Vitals:   04/17/24 1310  BP: (!) 155/74  Pulse: 91  SpO2: 97%      ROS:  Review of Systems  Cardiovascular:  Negative for chest pain, dyspnea on exertion, leg swelling, palpitations and syncope.     Studies Reviewed: SABRA        EKG 04/17/2024: Sinus rhythm with Premature atrial complexes When compared with ECG of 19-Nov-2013 08:30, Premature atrial complexes are now Present    Echocardiogram 2024: Normal LV systolic function with visual EF 60-65%. Left ventricle cavity is normal in size. Normal left ventricular wall thickness. Normal global wall motion. Normal diastolic filling pattern, normal LAP. No significant valvular heart disease. Compared to 05/08/2018: Grade 1 diastolic dysfunction is now normal otherwise no significant change.  Exercise nuclear stress test 2024: Myocardial perfusion is normal. Overall LV systolic function is normal without regional wall motion abnormalities. Stress LV EF: 67%.  Normal ECG stress. The patient exercised for 6 minutes and 44 seconds of a Bruce protocol, achieving approximately 8.21 METs & 86% MPHR. The blood pressure response was normal. No chest pain.  No previous exam available for comparison. Low risk.       CT cardiac scoring 2024: LM: 1.21 LAD: 278 RCA: 101 LCX 0   Total: 380    IMPRESSION: Coronary calcium  score of 380. This was 59 nd percentile for age and sex matched control.   1.  Aortic Atherosclerosis (ICD10-I70.0). 2. Hepatic steatosis.  Labs 02/2024: Chol 116, TG 212, HDL 42, LDL 40 HbA1C 10.1% Hb 11.1 Cr 1.3   Labs 12/2022: Chol 140, TG 291, HDL 39, LDL 64 HbA1C 9.9% Hb 11.2 Cr 1.19 K 4.9  06/21/2022: eGFR 52 HbA1C 7.2% Chol 205, TG 169, HDL 51, LDL 124 TSH 1.4 normal   Physical Exam:   Physical Exam Vitals and nursing note reviewed.  Constitutional:      General: She is not in acute distress. Neck:     Vascular: No JVD.  Cardiovascular:     Rate and Rhythm: Normal rate and regular rhythm.     Heart sounds: Normal heart sounds. No murmur heard. Pulmonary:     Effort: Pulmonary effort is normal.     Breath sounds: Normal breath sounds. No wheezing or rales.  Musculoskeletal:     Right lower leg: No edema.     Left lower leg: No edema.      VISIT DIAGNOSES:   ICD-10-CM   1. Coronary artery disease of native artery of native heart with stable angina pectoris  I25.118 EKG 12-Lead    2. Mixed hyperlipidemia  E78.2 EKG 12-Lead    3. Type 2 diabetes mellitus without complication, with long-term current use of insulin (HCC)  E11.9 EKG 12-Lead   Z79.4         ASSESSMENT AND PLAN: .  Kimberly Duke is a 69 y.o. female with hypertension, type 2 DM, elevated coronary calcium  score   Elevated coronary calcium  score: 92nd percentile (07/2022). Structurally normal heart on echocardiogram 08/2022. No ischemia on stress testing 07/2022. LDL well-controlled, continue Crestor  20 mg daily. Triglyceride elevated, likely due to poor glycemic control. Given no bleeding contraindication, recommend aspirin  81 mg daily, benefits outweigh risks.   Hypertension: Uncontrolled. Added losartan  50 mg daily for hypertension control, as well as renal protection in the setting of uncontrolled diabetes. Check BMP in 1 week.    Type 2  diabetes mellitus: Uncontrolled, remains her biggest cardiac risk factor. Discussed diet and lifestyle modification. Continue close follow-up with endocrinologist Dr. Ballin.       Meds ordered this encounter  Medications   aspirin  EC 81 MG tablet    Sig: Take 1 tablet (81 mg total) by mouth daily. Swallow whole.    Dispense:  90 tablet    Refill:  3   rosuvastatin  (CRESTOR ) 20 MG tablet    Sig: Take 1 tablet (20 mg total) by mouth daily.    Dispense:  90 tablet    Refill:  3   losartan  (COZAAR ) 50 MG tablet    Sig: Take 1 tablet (50 mg total) by mouth daily.    Dispense:  90 tablet    Refill:  3     F/u in 6 months At that time, blood pressure well-controlled, diabetes improving, and no cardiac symptoms, I can see her as needed.  Signed, Newman JINNY Lawrence, MD  "

## 2024-04-17 NOTE — Patient Instructions (Signed)
 Medication Instructions:  START Losartan  50 mg daily  *If you need a refill on your cardiac medications before your next appointment, please call your pharmacy*  Lab Work: BMP in 1 week  If you have labs (blood work) drawn today and your tests are completely normal, you will receive your results only by: MyChart Message (if you have MyChart) OR A paper copy in the mail If you have any lab test that is abnormal or we need to change your treatment, we will call you to review the results.   Follow-Up: At [facility], you and your health needs are our priority.  As part of our continuing mission to provide you with exceptional heart care, our providers are all part of one team.  This team includes your primary Cardiologist (physician) and Advanced Practice Providers or APPs (Physician Assistants and Nurse Practitioners) who all work together to provide you with the care you need, when you need it.  Your next appointment:   6 month(s)  Provider:   Newman JINNY Lawrence, MD

## 2024-04-25 LAB — BASIC METABOLIC PANEL WITH GFR
BUN/Creatinine Ratio: 17 (ref 12–28)
BUN: 24 mg/dL (ref 8–27)
CO2: 19 mmol/L — ABNORMAL LOW (ref 20–29)
Calcium: 10.5 mg/dL — ABNORMAL HIGH (ref 8.7–10.3)
Creatinine, Ser: 1.44 mg/dL — ABNORMAL HIGH (ref 0.57–1.00)
Glucose: 153 mg/dL — ABNORMAL HIGH (ref 70–99)
eGFR: 40 mL/min/{1.73_m2} — ABNORMAL LOW
